# Patient Record
Sex: Male | Born: 1950 | Race: White | Hispanic: No | Smoking: Former smoker
Health system: Southern US, Community
[De-identification: ages and names within clinical notes are randomized; demographics above are authoritative.]

## PROBLEM LIST (undated history)

## (undated) DIAGNOSIS — D369 Benign neoplasm, unspecified site: Secondary | ICD-10-CM

## (undated) DIAGNOSIS — K219 Gastro-esophageal reflux disease without esophagitis: Secondary | ICD-10-CM

## (undated) DIAGNOSIS — I10 Essential (primary) hypertension: Secondary | ICD-10-CM

## (undated) DIAGNOSIS — G4733 Obstructive sleep apnea (adult) (pediatric): Secondary | ICD-10-CM

## (undated) DIAGNOSIS — E785 Hyperlipidemia, unspecified: Secondary | ICD-10-CM

## (undated) DIAGNOSIS — M109 Gout, unspecified: Secondary | ICD-10-CM

## (undated) DIAGNOSIS — C439 Malignant melanoma of skin, unspecified: Secondary | ICD-10-CM

## (undated) HISTORY — DX: Essential (primary) hypertension: I10

## (undated) HISTORY — DX: Benign neoplasm, unspecified site: D36.9

## (undated) HISTORY — PX: BASAL CELL CARCINOMA EXCISION: SHX1214

## (undated) HISTORY — PX: MELANOMA EXCISION: SHX5266

## (undated) HISTORY — DX: Hyperlipidemia, unspecified: E78.5

## (undated) HISTORY — DX: Malignant melanoma of skin, unspecified: C43.9

## (undated) HISTORY — DX: Obstructive sleep apnea (adult) (pediatric): G47.33

---

## 2013-06-24 ENCOUNTER — Ambulatory Visit: Payer: Self-pay | Admitting: Orthopedic Surgery

## 2014-06-16 LAB — HEPATIC FUNCTION PANEL
ALT: 24 U/L (ref 10–40)
AST: 19 U/L (ref 14–40)
Alkaline Phosphatase: 75 U/L (ref 25–125)
BILIRUBIN, TOTAL: 0.5 mg/dL

## 2014-06-16 LAB — LIPID PANEL
CHOLESTEROL: 224 mg/dL — AB (ref 0–200)
HDL: 57 mg/dL (ref 35–70)
LDL Cholesterol: 109 mg/dL
Triglycerides: 292 mg/dL — AB (ref 40–160)

## 2014-11-11 DIAGNOSIS — G4733 Obstructive sleep apnea (adult) (pediatric): Secondary | ICD-10-CM | POA: Insufficient documentation

## 2014-11-11 DIAGNOSIS — E785 Hyperlipidemia, unspecified: Secondary | ICD-10-CM | POA: Insufficient documentation

## 2014-11-11 DIAGNOSIS — C4491 Basal cell carcinoma of skin, unspecified: Secondary | ICD-10-CM | POA: Insufficient documentation

## 2014-11-11 DIAGNOSIS — I10 Essential (primary) hypertension: Secondary | ICD-10-CM | POA: Insufficient documentation

## 2014-11-11 DIAGNOSIS — C439 Malignant melanoma of skin, unspecified: Secondary | ICD-10-CM | POA: Insufficient documentation

## 2014-12-23 ENCOUNTER — Other Ambulatory Visit: Payer: Self-pay | Admitting: Family Medicine

## 2015-01-17 ENCOUNTER — Ambulatory Visit (INDEPENDENT_AMBULATORY_CARE_PROVIDER_SITE_OTHER): Payer: Commercial Managed Care - PPO | Admitting: Family Medicine

## 2015-01-17 ENCOUNTER — Encounter: Payer: Self-pay | Admitting: Family Medicine

## 2015-01-17 VITALS — BP 130/70 | HR 72 | Temp 98.2°F | Resp 16 | Ht 71.0 in | Wt 210.4 lb

## 2015-01-17 DIAGNOSIS — Z0000 Encounter for general adult medical examination without abnormal findings: Secondary | ICD-10-CM | POA: Diagnosis not present

## 2015-01-17 DIAGNOSIS — Z1211 Encounter for screening for malignant neoplasm of colon: Secondary | ICD-10-CM | POA: Diagnosis not present

## 2015-01-17 DIAGNOSIS — Z23 Encounter for immunization: Secondary | ICD-10-CM | POA: Diagnosis not present

## 2015-01-17 DIAGNOSIS — Z125 Encounter for screening for malignant neoplasm of prostate: Secondary | ICD-10-CM | POA: Diagnosis not present

## 2015-01-17 LAB — POCT URINALYSIS DIPSTICK
BILIRUBIN UA: NEGATIVE
GLUCOSE UA: NEGATIVE
KETONES UA: NEGATIVE
Leukocytes, UA: NEGATIVE
Nitrite, UA: NEGATIVE
Protein, UA: NEGATIVE
RBC UA: NEGATIVE
SPEC GRAV UA: 1.01
Urobilinogen, UA: 0.2
pH, UA: 7.5

## 2015-01-17 LAB — IFOBT (OCCULT BLOOD): IFOBT: NEGATIVE

## 2015-01-17 NOTE — Progress Notes (Signed)
Patient: Joe Blankenship, Male    DOB: May 26, 1950, 64 y.o.   MRN: [id number] Visit Date: 01/17/2015  Today's Provider: [name], MD   Chief Complaint  Patient presents with  . Annual Exam   Subjective:    Annual physical exam Joe Blankenship is a 64 y.o. male who presents today for health maintenance and complete physical. He feels well. He reports exercising, walks twice a week 2 miles. He reports he is sleeping fairly well.  -----------------------------------------------------------------   Review of Systems  Constitutional: Negative.   HENT: Negative.   Eyes: Negative.   Respiratory: Negative.   Cardiovascular: Negative.   Gastrointestinal: Negative.   Endocrine: Negative.   Genitourinary: Negative.   Musculoskeletal: Negative.   Skin: Negative.   Allergic/Immunologic: Negative.   Neurological: Negative.   Hematological: Negative.   Psychiatric/Behavioral: Negative.     Social History He  reports that he has never smoked. He has never used smokeless tobacco. He reports that he drinks about 2.4 oz of alcohol per week. He reports that he does not use illicit drugs. Social History   Social History  . Marital Status: Single    Spouse Name: N/A  . Number of Children: N/A  . Years of Education: N/A   Social History Main Topics  . Smoking status: Never Smoker   . Smokeless tobacco: Never Used  . Alcohol Use: 2.4 oz/week    4 Cans of beer per week  . Drug Use: No  . Sexual Activity: Not Asked   Other Topics Concern  . None   Social History Narrative    Patient Active Problem List   Diagnosis Date Noted  . Basal cell carcinoma 11/11/2014  . Essential (primary) hypertension 11/11/2014  . Malignant melanoma 11/11/2014  . Obstructive apnea 11/11/2014  . HLD (hyperlipidemia) 11/11/2014    History reviewed. No pertinent past surgical history.  Family History  Family Status  Relation Status Death Age  . Mother Alive   . Father Deceased     His family history includes Heart disease in his father; Pancreatitis in his mother.    No Known Allergies  Previous Medications   ASPIRIN 81 MG TABLET    Take by mouth daily.   HYDROCHLOROTHIAZIDE (HYDRODIURIL) 12.5 MG TABLET    TAKE 1 TABLET BY MOUTH EVERY DAY   NIACIN (NIASPAN) 500 MG CR TABLET    TAKE 1 TABLET BY MOUTH AT BEDTIME   OLMESARTAN-AMLODIPINE-HCTZ (TRIBENZOR) 40-10-25 MG TABS    Take by mouth daily.   PRAVASTATIN (PRAVACHOL) 40 MG TABLET    Take by mouth at bedtime.    Patient Care Team: Jerrol Banana., MD as PCP - General (Family Medicine)     Objective:   Vitals: BP 130/70 mmHg  Pulse 72  Temp(Src) 98.2 F (36.8 C) (Oral)  Resp 16  Ht 5\' 11"  (1.803 m)  Wt 210 lb 6.4 oz (95.437 kg)  BMI 29.36 kg/m2   Physical Exam  Constitutional: He is oriented to person, place, and time. He appears well-developed and well-nourished.  HENT:  Head: Normocephalic and atraumatic.  Right Ear: External ear normal.  Left Ear: External ear normal.  Nose: Nose normal.  Mouth/Throat: Oropharynx is clear and moist.  Eyes: Conjunctivae are normal. Pupils are equal, round, and reactive to light.  Neck: Neck supple.  Cardiovascular: Normal rate, regular rhythm, normal heart sounds and intact distal pulses.   Pulmonary/Chest: Effort normal and breath sounds normal.  Abdominal: Soft.  Musculoskeletal:  Normal range of motion.  Neurological: He is alert and oriented to person, place, and time.  Skin: Skin is warm and dry.  Psychiatric: He has a normal mood and affect. His behavior is normal. Thought content normal.     Depression Screen No flowsheet data found.    Assessment & Plan:     Routine Health Maintenance and Physical Exam  Exercise Activities and Dietary recommendations Goals    None      Immunization History  Administered Date(s) Administered  . Tdap 01/11/2013  . Zoster 01/11/2013    Health Maintenance  Topic Date Due  . Hepatitis C  Screening  March 11, 1951  . COLONOSCOPY  06/06/1998  . PNA vac Low Risk Adult (1 of 2 - PCV13) 06/06/2013  . INFLUENZA VACCINE  12/18/2014  . TETANUS/TDAP  01/12/2023  . ZOSTAVAX  Completed      Discussed health benefits of physical activity, and encouraged him to engage in regular exercise appropriate for his age and condition.   Update Prevnar. I have done the exam and reviewed the above chart and it is accurate to the best of my knowledge.  --------------------------------------------------------------------

## 2015-04-05 ENCOUNTER — Encounter: Payer: Self-pay | Admitting: Family Medicine

## 2015-04-05 ENCOUNTER — Ambulatory Visit (INDEPENDENT_AMBULATORY_CARE_PROVIDER_SITE_OTHER): Payer: Commercial Managed Care - PPO | Admitting: Family Medicine

## 2015-04-05 VITALS — BP 142/78 | HR 88 | Temp 97.7°F | Resp 16 | Wt 213.0 lb

## 2015-04-05 DIAGNOSIS — M109 Gout, unspecified: Secondary | ICD-10-CM

## 2015-04-05 DIAGNOSIS — M10071 Idiopathic gout, right ankle and foot: Secondary | ICD-10-CM

## 2015-04-05 MED ORDER — COLCHICINE 0.6 MG PO CAPS: 0.6000 mg | ORAL_CAPSULE | Freq: Two times a day (BID) | ORAL | Status: DC

## 2015-04-05 MED ORDER — PREDNISONE 10 MG (48) PO TBPK: ORAL_TABLET | Freq: Every day | ORAL | Status: DC

## 2015-04-05 MED ORDER — HYDROCODONE-ACETAMINOPHEN 5-325 MG PO TABS: 1.0000 | ORAL_TABLET | ORAL | Status: DC | PRN

## 2015-04-05 NOTE — Progress Notes (Signed)
Patient ID: Joe Blankenship, male   DOB: [birth year], 64 y.o.   MRN: [id number]    Subjective:  HPI Pt reports that about 5 days ago he started having right foot pain at his great toe. He has used epsom salt soak and ice and has not helped. He reports that it is very tender to the touch and it hurt to walk and bend his foot. He does not recall injuring his foot, however he was loading some bricks and squatted down and felt a "pull" in his foot but he was fine the rest of the day and the next day his foot hurt so bad he could not stand it. He reports that it even hurts when the bed sheets hits his foot sometimes. Pt thinks he may have gout, he has never had it before.  No fever, chills Prior to Admission medications   Medication Sig Start Date End Date Taking? Authorizing Provider  aspirin 81 MG tablet Take by mouth daily. 01/11/13  Yes Historical Provider, MD  hydrochlorothiazide (HYDRODIURIL) 12.5 MG tablet TAKE 1 TABLET BY MOUTH EVERY DAY 12/25/14  Yes Jerrol Banana., MD  niacin (NIASPAN) 500 MG CR tablet TAKE 1 TABLET BY MOUTH AT BEDTIME 12/25/14  Yes Jerrol Banana., MD  Olmesartan-Amlodipine-HCTZ [facility]) 40-10-25 MG TABS Take by mouth daily. 07/17/14  Yes Historical Provider, MD  pravastatin (PRAVACHOL) 40 MG tablet Take by mouth at bedtime. 10/06/14  Yes Historical Provider, MD    Patient Active Problem List   Diagnosis Date Noted  . Basal cell carcinoma 11/11/2014  . Essential (primary) hypertension 11/11/2014  . Malignant melanoma ([place]) 11/11/2014  . Obstructive apnea 11/11/2014  . HLD (hyperlipidemia) 11/11/2014    Past Medical History  Diagnosis Date  . Hypertension   . Hyperlipidemia   . Obstructive sleep apnea   . Melanoma ([place])   . Melanoma [facility])     Social History   Social History  . Marital Status: Single    Spouse Name: N/A  . Number of Children: N/A  . Years of Education: N/A   Occupational History  . Not on file.   Social History Main Topics  .  Smoking status: Never Smoker   . Smokeless tobacco: Never Used  . Alcohol Use: 2.4 oz/week    4 Cans of beer per week  . Drug Use: No  . Sexual Activity: Not on file   Other Topics Concern  . Not on file   Social History Narrative    No Known Allergies  Review of Systems  Constitutional: Negative.   HENT: Negative.   Eyes: Negative.   Respiratory: Negative.   Cardiovascular: Negative.   Gastrointestinal: Negative.   Genitourinary: Negative.   Musculoskeletal: Positive for joint pain.  Skin: Negative.   Neurological: Negative.   Endo/Heme/Allergies: Negative.   Psychiatric/Behavioral: Negative.     Immunization History  Administered Date(s) Administered  . Pneumococcal Conjugate-13 01/17/2015  . Tdap 01/11/2013  . Zoster 01/11/2013   Objective:  BP 142/78 mmHg  Pulse 88  Temp(Src) 97.7 F (36.5 C) (Oral)  Resp 16  Wt 213 lb (96.616 kg)  Physical Exam  Constitutional: He is oriented to person, place, and time and well-developed, well-nourished, and in no distress.  HENT:  Head: Normocephalic and atraumatic.  Cardiovascular: Normal rate and intact distal pulses.   Pulmonary/Chest: Effort normal.  Abdominal: Soft.  Musculoskeletal:  Diffuse warmth and erythema and swelling of the right foot, especially around the first. MTP joint  Neurological: He  is alert and oriented to person, place, and time.  Skin: Skin is warm and dry. No rash noted. There is erythema.  Psychiatric: Mood, memory, affect and judgment normal.    Lab Results  Component Value Date   CHOL 224* 06/16/2014   TRIG 292* 06/16/2014   HDL 57 06/16/2014   LDLCALC 109 06/16/2014    CMP     Component Value Date/Time   AST 19 06/16/2014   ALT 24 06/16/2014   ALKPHOS 75 06/16/2014    Assessment and Plan :  1. Acute gout of right foot, unspecified cause Cellulitis much less likely for this patient. We'll treat first with colchicine and in 2 days this is not starting to improve will switch  to/add prednisone. - Colchicine 0.6 MG CAPS; Take 0.6 mg by mouth 2 (two) times daily.  Dispense: 60 capsule; Refill: 2 - HYDROcodone-acetaminophen (NORCO/VICODIN) 5-325 MG tablet; Take 1 tablet by mouth every 4 (four) hours as needed for moderate pain.  Dispense: 45 tablet; Refill: 0 - predniSONE (STERAPRED UNI-PAK 48 TAB) 10 MG (48) TBPK tablet; Take by mouth daily.  Dispense: 48 tablet; Refill: 0 - CBC with Differential/Platelet - Uric acid Stop HCTZ while having gout flare.  Follow up 2-3 weeks.  2. Hypertension  Return to clinic in 2-3 weeks for recheck of blood pressure. Make adjustments if it  is up. 3. Hyperlipidemia [facility] 04/05/2015 8:39 AM

## 2015-04-06 LAB — CBC WITH DIFFERENTIAL/PLATELET
BASOS: 1 %
Basophils Absolute: 0.1 10*3/uL (ref 0.0–0.2)
EOS (ABSOLUTE): 0.2 10*3/uL (ref 0.0–0.4)
EOS: 2 %
HEMATOCRIT: 43.8 % (ref 37.5–51.0)
HEMOGLOBIN: 14.8 g/dL (ref 12.6–17.7)
IMMATURE GRANULOCYTES: 1 %
Immature Grans (Abs): 0.1 10*3/uL (ref 0.0–0.1)
LYMPHS ABS: 3.5 10*3/uL — AB (ref 0.7–3.1)
Lymphs: 34 %
MCH: 32 pg (ref 26.6–33.0)
MCHC: 33.8 g/dL (ref 31.5–35.7)
MCV: 95 fL (ref 79–97)
MONOCYTES: 12 %
Monocytes Absolute: 1.2 10*3/uL — ABNORMAL HIGH (ref 0.1–0.9)
NEUTROS PCT: 50 %
Neutrophils Absolute: 5.4 10*3/uL (ref 1.4–7.0)
Platelets: 290 10*3/uL (ref 150–379)
RBC: 4.63 x10E6/uL (ref 4.14–5.80)
RDW: 14.1 % (ref 12.3–15.4)
WBC: 10.3 10*3/uL (ref 3.4–10.8)

## 2015-04-06 LAB — URIC ACID: Uric Acid: 7.2 mg/dL (ref 3.7–8.6)

## 2015-04-18 ENCOUNTER — Encounter: Payer: Self-pay | Admitting: Family Medicine

## 2015-04-18 ENCOUNTER — Ambulatory Visit (INDEPENDENT_AMBULATORY_CARE_PROVIDER_SITE_OTHER): Payer: Commercial Managed Care - PPO | Admitting: Family Medicine

## 2015-04-18 VITALS — BP 162/84 | HR 80 | Temp 98.6°F | Resp 12 | Wt 214.0 lb

## 2015-04-18 DIAGNOSIS — I10 Essential (primary) hypertension: Secondary | ICD-10-CM | POA: Diagnosis not present

## 2015-04-18 DIAGNOSIS — M10071 Idiopathic gout, right ankle and foot: Secondary | ICD-10-CM | POA: Diagnosis not present

## 2015-04-18 DIAGNOSIS — M109 Gout, unspecified: Secondary | ICD-10-CM

## 2015-04-18 NOTE — Progress Notes (Signed)
Patient ID: Amy Usher, male   DOB: 08-May-1951, 64 y.o.   MRN: [id number]    Subjective:  HPI  Patient is here to follow up on gout in his right foot. This is about 90% better. He is taking Colchicine daily, he has not had to take Prednisone and took 2 days worth of Norco. Swelling is better and pain is better, he can walk on that foot with slight pressure present still. Last Uric acid was 7.2 on November 17th.     He is still not taking HCTZ as he was advised on his visit. He has not been checking his B/P. Prior to Admission medications   Medication Sig Start Date End Date Taking? Authorizing Provider  aspirin 81 MG tablet Take by mouth daily. 01/11/13  Yes Historical Provider, MD  Colchicine 0.6 MG CAPS Take 0.6 mg by mouth 2 (two) times daily. 04/05/15  Yes Richard Maceo Pro., MD  niacin (NIASPAN) 500 MG CR tablet TAKE 1 TABLET BY MOUTH AT BEDTIME 12/25/14  Yes Jerrol Banana., MD  Olmesartan-Amlodipine-HCTZ [facility]) 40-10-25 MG TABS Take by mouth daily. 07/17/14  Yes Historical Provider, MD  pravastatin (PRAVACHOL) 40 MG tablet Take by mouth at bedtime. 10/06/14  Yes Historical Provider, MD  hydrochlorothiazide (HYDRODIURIL) 12.5 MG tablet TAKE 1 TABLET BY MOUTH EVERY DAY Patient not taking: Reported on 04/18/2015 12/25/14   Jerrol Banana., MD  HYDROcodone-acetaminophen (NORCO/VICODIN) 5-325 MG tablet Take 1 tablet by mouth every 4 (four) hours as needed for moderate pain. Patient not taking: Reported on 04/18/2015 04/05/15   Jerrol Banana., MD  predniSONE (STERAPRED UNI-PAK 48 TAB) 10 MG (48) TBPK tablet Take by mouth daily. Patient not taking: Reported on 04/18/2015 04/05/15   Jerrol Banana., MD    Patient Active Problem List   Diagnosis Date Noted  . Basal cell carcinoma 11/11/2014  . Essential (primary) hypertension 11/11/2014  . Malignant melanoma ([place]) 11/11/2014  . Obstructive apnea 11/11/2014  . HLD (hyperlipidemia) 11/11/2014    Past  Medical History  Diagnosis Date  . Hypertension   . Hyperlipidemia   . Obstructive sleep apnea   . Melanoma ([place])   . Melanoma [facility])     Social History   Social History  . Marital Status: Single    Spouse Name: N/A  . Number of Children: N/A  . Years of Education: N/A   Occupational History  . Not on file.   Social History Main Topics  . Smoking status: Never Smoker   . Smokeless tobacco: Never Used  . Alcohol Use: 2.4 oz/week    4 Cans of beer per week  . Drug Use: No  . Sexual Activity: Not on file   Other Topics Concern  . Not on file   Social History Narrative    No Known Allergies  Review of Systems  Constitutional: Negative.   Eyes: Negative.   Respiratory: Negative.   Cardiovascular: Negative.   Gastrointestinal: Negative.   Musculoskeletal: Positive for joint pain.  Neurological: Negative.   Psychiatric/Behavioral: Negative.     Immunization History  Administered Date(s) Administered  . Pneumococcal Conjugate-13 01/17/2015  . Tdap 01/11/2013  . Zoster 01/11/2013   Objective:  BP 162/84 mmHg  Pulse 80  Temp(Src) 98.6 F (37 C)  Resp 12  Wt 214 lb (97.07 kg)  Physical Exam  Constitutional: He is oriented to person, place, and time and well-developed, well-nourished, and in no distress.  HENT:  Head: Normocephalic and atraumatic.  Right Ear: External ear normal.  Left Ear: External ear normal.  Nose: Nose normal.  Eyes: Conjunctivae are normal.  Neck: Neck supple.  Pulmonary/Chest: Effort normal.  Abdominal: Soft.  Musculoskeletal: He exhibits no edema or tenderness.   Foot exam back to normal.  Neurological: He is alert and oriented to person, place, and time. Gait normal.  Skin: Skin is warm and dry.  Psychiatric: Mood, memory, affect and judgment normal.    Lab Results  Component Value Date   WBC 10.3 04/05/2015   HCT 43.8 04/05/2015   CHOL 224* 06/16/2014   TRIG 292* 06/16/2014   HDL 57 06/16/2014   LDLCALC 109 06/16/2014      CMP     Component Value Date/Time   AST 19 06/16/2014   ALT 24 06/16/2014   ALKPHOS 75 06/16/2014    Assessment and Plan :  1. Acute gout of right foot, unspecified cause 90% better today. Discussed last uric acid level. Advised patient to take Colchicine 1 tablet daily for 2 more weeks and then can stop.Uric Acid was 7.2 .  2. Essential (primary) hypertension Elevated off medication. Discussed with patient that HCTZ in some people puts them more at risk for gout flare up. Patient feels comfortable with just re starting HCTZ. If he gets another flare up will need to switch to another medication for B/P.   Patient was seen and examined by Dr. Eulas Post and note was scribed by Theressa Millard, RMA.    Miguel Aschoff MD [facility] 04/18/2015 10:25 AM

## 2015-06-30 ENCOUNTER — Other Ambulatory Visit: Payer: Self-pay | Admitting: Family Medicine

## 2015-07-18 ENCOUNTER — Ambulatory Visit (INDEPENDENT_AMBULATORY_CARE_PROVIDER_SITE_OTHER): Payer: Commercial Managed Care - PPO | Admitting: Family Medicine

## 2015-07-18 ENCOUNTER — Encounter: Payer: Self-pay | Admitting: Family Medicine

## 2015-07-18 VITALS — BP 130/66 | HR 84 | Temp 97.6°F | Resp 16 | Wt 216.0 lb

## 2015-07-18 DIAGNOSIS — E785 Hyperlipidemia, unspecified: Secondary | ICD-10-CM

## 2015-07-18 DIAGNOSIS — M10071 Idiopathic gout, right ankle and foot: Secondary | ICD-10-CM | POA: Diagnosis not present

## 2015-07-18 DIAGNOSIS — M109 Gout, unspecified: Secondary | ICD-10-CM

## 2015-07-18 DIAGNOSIS — I10 Essential (primary) hypertension: Secondary | ICD-10-CM | POA: Diagnosis not present

## 2015-07-18 DIAGNOSIS — R739 Hyperglycemia, unspecified: Secondary | ICD-10-CM

## 2015-07-18 DIAGNOSIS — C4491 Basal cell carcinoma of skin, unspecified: Secondary | ICD-10-CM

## 2015-07-18 NOTE — Progress Notes (Signed)
Patient ID: Nubaid Fekete, male   DOB: 07/15/1950, 65 y.o.   MRN: [id number]    Subjective:  HPI  Patient is here for follow up:  Hypertension: Patient re started HCTZ in November 2016. B/P has been around 138-140/78-82 when he checks it. No cardiac symptoms. BP Readings from Last 3 Encounters:  07/18/15 130/66  04/18/15 162/84  04/05/15 142/78    Gout: Patient is not taking Colchicine anymore. No gout flare ups in a while so far.  He did not get labs done that were ordered in August so he will get those done today.  Prior to Admission medications   Medication Sig Start Date End Date Taking? Authorizing Provider  aspirin 81 MG tablet Take by mouth daily. 01/11/13  Yes Historical Provider, MD  Colchicine 0.6 MG CAPS Take 0.6 mg by mouth 2 (two) times daily. 04/05/15  Yes Richard Maceo Pro., MD  hydrochlorothiazide (HYDRODIURIL) 12.5 MG tablet TAKE 1 TABLET BY MOUTH EVERY DAY 12/25/14  Yes Jerrol Banana., MD  niacin (NIASPAN) 500 MG CR tablet TAKE 1 TABLET BY MOUTH AT BEDTIME 07/02/15  Yes Richard Maceo Pro., MD  Olmesartan-Amlodipine-HCTZ [facility]) 40-10-25 MG TABS Take by mouth daily. 07/17/14  Yes Historical Provider, MD  pravastatin (PRAVACHOL) 40 MG tablet Take by mouth at bedtime. 10/06/14  Yes Historical Provider, MD    Patient Active Problem List   Diagnosis Date Noted  . Basal cell carcinoma 11/11/2014  . Essential (primary) hypertension 11/11/2014  . Malignant melanoma ([place]) 11/11/2014  . Obstructive apnea 11/11/2014  . HLD (hyperlipidemia) 11/11/2014    Past Medical History  Diagnosis Date  . Hypertension   . Hyperlipidemia   . Obstructive sleep apnea   . Melanoma ([place])   . Melanoma [facility])     Social History   Social History  . Marital Status: Single    Spouse Name: N/A  . Number of Children: N/A  . Years of Education: N/A   Occupational History  . Not on file.   Social History Main Topics  . Smoking status: Never Smoker   . Smokeless tobacco:  Never Used  . Alcohol Use: 2.4 oz/week    4 Cans of beer per week  . Drug Use: No  . Sexual Activity: Not on file   Other Topics Concern  . Not on file   Social History Narrative    No Known Allergies  Review of Systems  Constitutional: Negative.   HENT: Negative.   Eyes: Negative.   Respiratory: Negative.   Cardiovascular: Negative.   Gastrointestinal: Negative.   Musculoskeletal: Negative.   Skin: Negative.   Endo/Heme/Allergies: Negative.   Psychiatric/Behavioral: Negative.     Immunization History  Administered Date(s) Administered  . Pneumococcal Conjugate-13 01/17/2015  . Tdap 01/11/2013  . Zoster 01/11/2013   Objective:  BP 130/66 mmHg  Pulse 84  Temp(Src) 97.6 F (36.4 C)  Resp 16  Wt 216 lb (97.977 kg)  Physical Exam  Constitutional: He is oriented to person, place, and time and well-developed, well-nourished, and in no distress.  HENT:  Head: Normocephalic and atraumatic.  Right Ear: External ear normal.  Left Ear: External ear normal.  Nose: Nose normal.  Eyes: Conjunctivae are normal. Pupils are equal, round, and reactive to light.  Neck: Neck supple. No thyromegaly present.  Cardiovascular: Normal rate, regular rhythm, normal heart sounds and intact distal pulses.   Pulmonary/Chest: Effort normal and breath sounds normal. No respiratory distress. He has no wheezes.  Abdominal: Soft.  Musculoskeletal: He exhibits no edema or tenderness.  Neurological: He is alert and oriented to person, place, and time. Gait normal.  Skin: Skin is warm and dry.  Psychiatric: Mood, memory, affect and judgment normal.    Lab Results  Component Value Date   WBC 10.3 04/05/2015   HCT 43.8 04/05/2015   PLT 290 04/05/2015   CHOL 224* 06/16/2014   TRIG 292* 06/16/2014   HDL 57 06/16/2014   LDLCALC 109 06/16/2014    CMP     Component Value Date/Time   AST 19 06/16/2014   ALT 24 06/16/2014   ALKPHOS 75 06/16/2014    Assessment and Plan :  1. Essential  (primary) hypertension Stable on HCTZ. Discussed that HCTZ  Can contribute to flare ups of gout but patient wants to continue current treatment with no change. - TSH  2. Acute gout of right foot, unspecified cause Resolved so far. He has not had to take colchicine in over a month. Will check uric acid today depending on results may need to stop hctz and re check levels in a few month to see if there is improvement. Pending results. - TSH  3. HLD (hyperlipidemia) Check levels, he did not get this done in august. - Lipid Panel With LDL/HDL Ratio - Comprehensive metabolic panel  4. Hyperglycemia Elevated in the past. Will check a1c. - HgB A1c  5. [place] Several. Following dermatologist. I have done the exam and reviewed the above chart and it is accurate to the best of my knowledge.  Patient was seen and examined by Dr. Eulas Post and note was scribed by Theressa Millard, RMA.   Miguel Aschoff MD [facility] 07/18/2015 8:09 AM

## 2015-07-19 LAB — COMPREHENSIVE METABOLIC PANEL
ALT: 24 IU/L (ref 0–44)
AST: 22 IU/L (ref 0–40)
Albumin/Globulin Ratio: 1.7 (ref 1.1–2.5)
Albumin: 4.8 g/dL (ref 3.6–4.8)
Alkaline Phosphatase: 68 IU/L (ref 39–117)
BUN/Creatinine Ratio: 23 — ABNORMAL HIGH (ref 10–22)
BUN: 20 mg/dL (ref 8–27)
Bilirubin Total: 0.4 mg/dL (ref 0.0–1.2)
CALCIUM: 9.9 mg/dL (ref 8.6–10.2)
CO2: 22 mmol/L (ref 18–29)
CREATININE: 0.88 mg/dL (ref 0.76–1.27)
Chloride: 99 mmol/L (ref 96–106)
GFR calc Af Amer: 103 mL/min/{1.73_m2} (ref >59)
GFR, EST NON AFRICAN AMERICAN: 89 mL/min/{1.73_m2} (ref >59)
GLUCOSE: 107 mg/dL — AB (ref 65–99)
Globulin, Total: 2.8 g/dL (ref 1.5–4.5)
POTASSIUM: 5.3 mmol/L — AB (ref 3.5–5.2)
Sodium: 141 mmol/L (ref 134–144)
TOTAL PROTEIN: 7.6 g/dL (ref 6.0–8.5)

## 2015-07-19 LAB — LIPID PANEL WITH LDL/HDL RATIO
Cholesterol, Total: 250 mg/dL — ABNORMAL HIGH (ref 100–199)
HDL: 54 mg/dL (ref >39)
Triglycerides: 573 mg/dL (ref 0–149)

## 2015-07-19 LAB — HEMOGLOBIN A1C
ESTIMATED AVERAGE GLUCOSE: 126 mg/dL
HEMOGLOBIN A1C: 6 % — AB (ref 4.8–5.6)

## 2015-07-19 LAB — URIC ACID: Uric Acid: 11.9 mg/dL — ABNORMAL HIGH (ref 3.7–8.6)

## 2015-07-19 LAB — TSH: TSH: 1.32 u[IU]/mL (ref 0.450–4.500)

## 2015-07-28 ENCOUNTER — Other Ambulatory Visit: Payer: Self-pay | Admitting: Family Medicine

## 2015-09-12 ENCOUNTER — Ambulatory Visit (INDEPENDENT_AMBULATORY_CARE_PROVIDER_SITE_OTHER): Payer: Commercial Managed Care - PPO | Admitting: Family Medicine

## 2015-09-12 ENCOUNTER — Encounter: Payer: Self-pay | Admitting: Family Medicine

## 2015-09-12 VITALS — BP 124/70 | Temp 98.2°F | Resp 16 | Ht 71.0 in | Wt 208.0 lb

## 2015-09-12 DIAGNOSIS — E781 Pure hyperglyceridemia: Secondary | ICD-10-CM | POA: Diagnosis not present

## 2015-09-12 DIAGNOSIS — I10 Essential (primary) hypertension: Secondary | ICD-10-CM

## 2015-09-12 DIAGNOSIS — M109 Gout, unspecified: Secondary | ICD-10-CM

## 2015-09-12 NOTE — Progress Notes (Signed)
Patient ID: Joe Blankenship, male   DOB: [birth year], 65 y.o.   MRN: [id number]       Patient: Joe Blankenship Male    DOB: Nov 09, 1950   65 y.o.   MRN: [id number] Visit Date: 09/12/2015  Today's Provider: [name], MD   Chief Complaint  Patient presents with  . Hypertension  . Gout    1 month FU.   Marland Kitchen Hyperlipidemia   Subjective:    HPI Patient is here today for a follow up.  Hypertension: Patient reports that he does check his BP occasionally. He reports that it runs 130/120s/60-70s. He reports that he is compliant with taking his BP medication.  BP Readings from Last 3 Encounters:  09/12/15 124/70  07/18/15 130/66  04/18/15 162/84   Gout: Patient's uric acid was elevated on 07/18/15 at 11.9. Patient currently does not take anything for gout. He reports that he has not had a flare up since LOV. We were considering starting Allopurinol in the future.  Elevated triglycerides: Patient's triglycerides were 573 on 07/18/2015. Patient reports that he has done more diet and exercise since LOV. He has lost 8lbs.     No Known Allergies Previous Medications   ASPIRIN 81 MG TABLET    Take by mouth daily.   COLCHICINE 0.6 MG CAPS    Take 0.6 mg by mouth 2 (two) times daily.   HYDROCHLOROTHIAZIDE (HYDRODIURIL) 12.5 MG TABLET    TAKE 1 TABLET BY MOUTH EVERY DAY   NIACIN (NIASPAN) 500 MG CR TABLET    TAKE 1 TABLET BY MOUTH AT BEDTIME   OLMESARTAN-AMLODIPINE-HCTZ 40-10-25 MG TABS    TAKE 1 TABLET BY MOUTH DAILY   PRAVASTATIN (PRAVACHOL) 40 MG TABLET    Take by mouth at bedtime.    Review of Systems  Constitutional: Negative.   Respiratory: Negative.   Cardiovascular: Negative.   Musculoskeletal: Negative.   Neurological: Negative.     Social History  Substance Use Topics  . Smoking status: Never Smoker   . Smokeless tobacco: Never Used  . Alcohol Use: 2.4 oz/week    4 Cans of beer per week   Objective:   BP 124/70 mmHg  Temp(Src) 98.2 F (36.8 C)  Resp 16  Ht 5\' 11"  (1.803  m)  Wt 208 lb (94.348 kg)  BMI 29.02 kg/m2  Physical Exam  Constitutional: He is oriented to person, place, and time. He appears well-developed and well-nourished.  HENT:  Head: Normocephalic and atraumatic.  Right Ear: External ear normal.  Left Ear: External ear normal.  Nose: Nose normal.  Cardiovascular: Normal rate, regular rhythm and normal heart sounds.   Pulmonary/Chest: Effort normal and breath sounds normal.  Musculoskeletal: He exhibits no edema.  Neurological: He is alert and oriented to person, place, and time.  Skin: Skin is warm.  Psychiatric: He has a normal mood and affect. His behavior is normal. Judgment and thought content normal.        Assessment & Plan:     1. Essential (primary) hypertension Stable. RTC 3-5 months. 2. Gout, unspecified cause, unspecified chronicity, unspecified site Stable. Patient reports that he has not had any flare ups since his last visit. Will continue to monitor. We'll repeat uric acid and lipids on next visit. 3. Hypertriglyceridemia Condition is stable with diet and exercise. Patient has lost 8lbs since last OV. Set weight loss goal to another 8lbs on F/U in 3-5 months.         Yuval Rubens Cranford Mon, MD  [facility]  

## 2015-10-29 ENCOUNTER — Other Ambulatory Visit: Payer: Self-pay | Admitting: Family Medicine

## 2015-12-14 ENCOUNTER — Ambulatory Visit (INDEPENDENT_AMBULATORY_CARE_PROVIDER_SITE_OTHER): Payer: Commercial Managed Care - PPO | Admitting: Family Medicine

## 2015-12-14 ENCOUNTER — Encounter: Payer: Self-pay | Admitting: Family Medicine

## 2015-12-14 VITALS — BP 136/74 | HR 72 | Temp 97.8°F | Resp 16 | Wt 211.0 lb

## 2015-12-14 DIAGNOSIS — M109 Gout, unspecified: Secondary | ICD-10-CM

## 2015-12-14 DIAGNOSIS — M10072 Idiopathic gout, left ankle and foot: Secondary | ICD-10-CM | POA: Diagnosis not present

## 2015-12-14 MED ORDER — COLCHICINE 0.6 MG PO CAPS: 0.6000 mg | ORAL_CAPSULE | Freq: Two times a day (BID) | ORAL | 5 refills | Status: DC

## 2015-12-14 NOTE — Progress Notes (Signed)
       Patient: Joe Blankenship Male    DOB: Oct 08, 1950   66 y.o.   MRN: [id number] Visit Date: 12/14/2015  Today's Provider: [name], MD   Chief Complaint  Patient presents with  . Foot Pain    X 2 weeks.    Subjective:    Foot Pain  This is a recurrent problem. The current episode started 1 to 4 weeks ago. The problem occurs constantly. The problem has been gradually worsening. Associated symptoms include arthralgias and joint swelling. The symptoms are aggravated by walking. He has tried heat, ice and relaxation for the symptoms. The treatment provided no relief.  Patient reports that the pain is in his left foot. The majority of his pain is located in his big toe. Patient has had history of gout and he believes he is having another flare.      No Known Allergies Current Meds  Medication Sig  . aspirin 81 MG tablet Take by mouth daily.  . hydrochlorothiazide (HYDRODIURIL) 12.5 MG tablet TAKE 1 TABLET BY MOUTH EVERY DAY  . niacin (NIASPAN) 500 MG CR tablet TAKE 1 TABLET BY MOUTH AT BEDTIME  . Olmesartan-Amlodipine-HCTZ 40-10-25 MG TABS TAKE 1 TABLET BY MOUTH DAILY  . pravastatin (PRAVACHOL) 40 MG tablet TAKE 1 TABLET BY MOUTH EVERY DAY    Review of Systems  Constitutional: Negative.   Musculoskeletal: Positive for arthralgias, gait problem and joint swelling.    Social History  Substance Use Topics  . Smoking status: Never Smoker  . Smokeless tobacco: Never Used  . Alcohol use 2.4 oz/week    4 Cans of beer per week   Objective:   BP 136/74 (BP Location: Right Arm, Patient Position: Sitting, Cuff Size: Normal)   Pulse 72   Temp 97.8 F (36.6 C)   Resp 16   Wt 211 lb (95.7 kg)   BMI 29.43 kg/m   Physical Exam  Musculoskeletal: He exhibits edema and tenderness.  Swelling and warmth with erythema with mild tenderness of the left great MTP joint  Skin: Skin is warm and dry. There is erythema.        Assessment & Plan:     1. Acute gout of left foot,  unspecified cause On next visit will consider treating with allopurinol with goal of uric acid less than 6. His last attack was a year ago so we discussed it and he wishes not to start allopurinol after just 2 attacks in a year. - Colchicine 0.6 MG CAPS; Take 0.6 mg by mouth 2 (two) times daily.  Dispense: 60 capsule; Refill: 5      I have done the exam and reviewed the above chart and it is accurate to the best of my knowledge.  Arianah Torgeson Cranford Mon, MD  [facility]

## 2015-12-25 ENCOUNTER — Other Ambulatory Visit: Payer: Self-pay | Admitting: Family Medicine

## 2016-01-03 ENCOUNTER — Ambulatory Visit (INDEPENDENT_AMBULATORY_CARE_PROVIDER_SITE_OTHER): Payer: Commercial Managed Care - PPO | Admitting: Family Medicine

## 2016-01-03 ENCOUNTER — Encounter: Payer: Self-pay | Admitting: Family Medicine

## 2016-01-03 VITALS — BP 122/68 | HR 68 | Temp 98.0°F | Resp 16 | Wt 208.0 lb

## 2016-01-03 DIAGNOSIS — M109 Gout, unspecified: Secondary | ICD-10-CM

## 2016-01-03 DIAGNOSIS — I10 Essential (primary) hypertension: Secondary | ICD-10-CM

## 2016-01-03 DIAGNOSIS — G4700 Insomnia, unspecified: Secondary | ICD-10-CM | POA: Diagnosis not present

## 2016-01-03 DIAGNOSIS — M10079 Idiopathic gout, unspecified ankle and foot: Secondary | ICD-10-CM

## 2016-01-03 DIAGNOSIS — E785 Hyperlipidemia, unspecified: Secondary | ICD-10-CM | POA: Diagnosis not present

## 2016-01-03 MED ORDER — ALLOPURINOL 100 MG PO TABS: 100.0000 mg | ORAL_TABLET | Freq: Every day | ORAL | 5 refills | Status: DC

## 2016-01-03 NOTE — Progress Notes (Signed)
       Patient: Joe Blankenship Male    DOB: [birth year]   65 y.o.   MRN: [id number] Visit Date: 01/03/2016  Today's Provider: [name], MD   Chief Complaint  Patient presents with  . Hypertension  . Hyperlipidemia  . Gout   Subjective:    HPI Patient comes in today for a follow up on hypertension. Patient reports that he still checks his BP at home and it has been stable. Patient denies any chest pain, shortness of breath, or headaches. He has been compliant with taking his medications.  Patient is also here to follow up on cholesterol. Patient's last lipid panel was on 07/18/2015 and triglycerides were 573. Patient is currently on Pravastatin 40mg  daily and has been tolerating medication well.   Patient also mentions that he continues to have gout flares. He reports that he feels like chochicne 0.6mg  helps a little but not much.      No Known Allergies Current Meds  Medication Sig  . aspirin 81 MG tablet Take by mouth daily.  . Colchicine 0.6 MG CAPS Take 0.6 mg by mouth 2 (two) times daily.  . hydrochlorothiazide (HYDRODIURIL) 12.5 MG tablet TAKE 1 TABLET BY MOUTH EVERY DAY  . niacin (NIASPAN) 500 MG CR tablet TAKE 1 TABLET BY MOUTH AT BEDTIME  . Olmesartan-Amlodipine-HCTZ 40-10-25 MG TABS TAKE 1 TABLET BY MOUTH DAILY  . pravastatin (PRAVACHOL) 40 MG tablet TAKE 1 TABLET BY MOUTH EVERY DAY    Review of Systems  Constitutional: Negative.   Eyes: Negative.   Respiratory: Negative.   Cardiovascular: Negative.   Endocrine: Negative.   Musculoskeletal: Negative.   Allergic/Immunologic: Negative.   Neurological: Negative.   Psychiatric/Behavioral: Negative.     Social History  Substance Use Topics  . Smoking status: Never Smoker  . Smokeless tobacco: Never Used  . Alcohol use 2.4 oz/week    4 Cans of beer per week   Objective:   BP 122/68 (BP Location: Right Arm, Patient Position: Sitting, Cuff Size: Normal)   Pulse 68   Temp 98 F (36.7 C)   Resp 16   Wt  208 lb (94.3 kg)   BMI 29.01 kg/m   Physical Exam  Constitutional: He is oriented to person, place, and time. He appears well-developed and well-nourished.  HENT:  Head: Normocephalic and atraumatic.  Eyes: Conjunctivae are normal.  Neck: Neck supple. No thyromegaly present.  Cardiovascular: Normal rate, regular rhythm and normal heart sounds.   Pulmonary/Chest: Effort normal and breath sounds normal.  Abdominal: Soft.  Lymphadenopathy:    He has no cervical adenopathy.  Neurological: He is alert and oriented to person, place, and time.  Skin: Skin is warm and dry.  Psychiatric: He has a normal mood and affect. His behavior is normal. Judgment and thought content normal.        Assessment & Plan:     1. Essential (primary) hypertension   2. HLD (hyperlipidemia)   3. Gout of foot, unspecified cause, unspecified chronicity, unspecified laterality Presently controlled. Try to get uric acid less than 6. - allopurinol (ZYLOPRIM) 100 MG tablet; Take 1 tablet (100 mg total) by mouth daily.  Dispense: 30 tablet; Refill: 5  4. Insomnia Advised patient to stop all alcohol use at night to see if insomnia improves.         Richard Cranford Mon, MD  [facility]

## 2016-01-22 ENCOUNTER — Encounter: Payer: Commercial Managed Care - PPO | Admitting: Family Medicine

## 2016-03-05 ENCOUNTER — Ambulatory Visit (INDEPENDENT_AMBULATORY_CARE_PROVIDER_SITE_OTHER): Payer: Commercial Managed Care - PPO | Admitting: Family Medicine

## 2016-03-05 ENCOUNTER — Encounter: Payer: Self-pay | Admitting: Family Medicine

## 2016-03-05 VITALS — BP 130/72 | HR 64 | Temp 97.9°F | Resp 16 | Ht 70.0 in | Wt 212.0 lb

## 2016-03-05 DIAGNOSIS — Z23 Encounter for immunization: Secondary | ICD-10-CM

## 2016-03-05 DIAGNOSIS — M109 Gout, unspecified: Secondary | ICD-10-CM | POA: Diagnosis not present

## 2016-03-05 DIAGNOSIS — Z0000 Encounter for general adult medical examination without abnormal findings: Secondary | ICD-10-CM

## 2016-03-05 DIAGNOSIS — E7800 Pure hypercholesterolemia, unspecified: Secondary | ICD-10-CM | POA: Diagnosis not present

## 2016-03-05 DIAGNOSIS — G4700 Insomnia, unspecified: Secondary | ICD-10-CM | POA: Insufficient documentation

## 2016-03-05 DIAGNOSIS — I10 Essential (primary) hypertension: Secondary | ICD-10-CM

## 2016-03-05 DIAGNOSIS — Z1211 Encounter for screening for malignant neoplasm of colon: Secondary | ICD-10-CM

## 2016-03-05 LAB — POCT URINALYSIS DIPSTICK
BILIRUBIN UA: NEGATIVE
GLUCOSE UA: NEGATIVE
Ketones, UA: NEGATIVE
Leukocytes, UA: NEGATIVE
NITRITE UA: NEGATIVE
Protein, UA: NEGATIVE
RBC UA: NEGATIVE
Spec Grav, UA: 1.01
Urobilinogen, UA: 0.2
pH, UA: 7.5

## 2016-03-05 LAB — IFOBT (OCCULT BLOOD): IMMUNOLOGICAL FECAL OCCULT BLOOD TEST: NEGATIVE

## 2016-03-05 NOTE — Progress Notes (Signed)
Patient: Joe Blankenship, Male    DOB: 1951-01-27, 65 y.o.   MRN: [id number] Visit Date: 03/05/2016  Today's Provider: [name], MD   Chief Complaint  Patient presents with  . Annual Exam  . Gout  . Insomnia   Subjective:    Annual physical exam Joe Blankenship is a 65 y.o. male who presents today for health maintenance and complete physical. He feels well. He reports exercising daily; walks for about a mile. He reports he is sleeping fairly well.  Never had colonoscopy. Refuses flu vaccine today.  -----------------------------------------------------------------  Follow up for Gout  The patient was last seen for this 2 months ago. Changes made at last visit include starting allopurinol.  He reports fair compliance with treatment. States he forgets to take medication about half of the time. He feels that condition is Improved. He is not having side effects.  ------------------------------------------------------------------------------------  Follow up for Insomnia  The patient was last seen for this 2 months ago. Changes made at last visit include stopping all alcohol.  He reports poor compliance with treatment. Still drinking about 3 beers per night. He feels that condition is Unchanged.   ------------------------------------------------------------------------------------       Review of Systems  Constitutional: Negative.   HENT: Negative.   Eyes: Negative.   Respiratory: Negative.   Cardiovascular: Negative.   Gastrointestinal: Negative.   Endocrine: Negative.   Genitourinary: Negative.   Musculoskeletal: Negative.   Skin: Negative.   Allergic/Immunologic: Negative.   Neurological: Negative.   Hematological: Negative.   Psychiatric/Behavioral: Negative.     Social History      He  reports that he quit smoking about 38 years ago. He quit after 14.00 years of use. He has never used smokeless tobacco. He reports that he drinks about 12.6  oz of alcohol per week . He reports that he does not use drugs.       Social History   Social History  . Marital status: Single    Spouse name: N/A  . Number of children: 0  . Years of education: N/A   Occupational History  .  [employer]   Social History Main Topics  . Smoking status: Former Smoker    Years: 14.00    Quit date: 05/18/1977  . Smokeless tobacco: Never Used  . Alcohol use 12.6 oz/week    21 Cans of beer per week  . Drug use: No  . Sexual activity: Not Currently   Other Topics Concern  . None   Social History Narrative  . None    Past Medical History:  Diagnosis Date  . Hyperlipidemia   . Hypertension   . Melanoma ([place])   . Melanoma ([place])   . Obstructive sleep apnea      Patient Active Problem List   Diagnosis Date Noted  . Basal cell carcinoma 11/11/2014  . Essential (primary) hypertension 11/11/2014  . Malignant melanoma ([place]) 11/11/2014  . Obstructive apnea 11/11/2014  . HLD (hyperlipidemia) 11/11/2014    Past Surgical History:  Procedure Laterality Date  . BASAL CELL CARCINOMA EXCISION    . MELANOMA EXCISION     x 2    Family History        Family Status  Relation Status  . Mother Alive  . Father Deceased at age 74        His family history includes Heart attack in his father; Heart disease in his father; Hyperlipidemia in his father; Hypertension in his  father; Pancreatitis in his mother.    No Known Allergies  Current Meds  Medication Sig  . allopurinol (ZYLOPRIM) 100 MG tablet Take 1 tablet (100 mg total) by mouth daily.  Marland Kitchen aspirin 81 MG tablet Take by mouth daily.  . hydrochlorothiazide (HYDRODIURIL) 12.5 MG tablet TAKE 1 TABLET BY MOUTH EVERY DAY  . niacin (NIASPAN) 500 MG CR tablet TAKE 1 TABLET BY MOUTH AT BEDTIME  . Olmesartan-Amlodipine-HCTZ 40-10-25 MG TABS TAKE 1 TABLET BY MOUTH DAILY  . pravastatin (PRAVACHOL) 40 MG tablet TAKE 1 TABLET BY MOUTH EVERY DAY    Patient Care Team: Jerrol Banana., MD as PCP  - General (Family Medicine)     Objective:   Vitals: BP 130/72 (BP Location: Right Arm, Patient Position: Sitting, Cuff Size: Large)   Pulse 64   Temp 97.9 F (36.6 C) (Oral)   Resp 16   Ht 5\' 10"  (1.778 m)   Wt 212 lb (96.2 kg)   BMI 30.42 kg/m    Physical Exam  Constitutional: He is oriented to person, place, and time. He appears well-developed and well-nourished.  HENT:  Head: Normocephalic and atraumatic.  Right Ear: External ear normal.  Left Ear: External ear normal.  Nose: Nose normal.  Mouth/Throat: Oropharynx is clear and moist. No oropharyngeal exudate.  Eyes: Conjunctivae and EOM are normal. Pupils are equal, round, and reactive to light. Right eye exhibits no discharge. Left eye exhibits no discharge.  Neck: Normal range of motion. Neck supple. No tracheal deviation present. No thyromegaly present.  Cardiovascular: Normal rate, regular rhythm and normal heart sounds.   Pulmonary/Chest: Effort normal and breath sounds normal. No respiratory distress.  Abdominal: Soft. He exhibits no distension. There is no tenderness.  Genitourinary: Rectum normal, prostate normal and penis normal. Rectal exam shows guaiac negative stool.  Musculoskeletal: Normal range of motion. He exhibits no edema.  Lymphadenopathy:    He has no cervical adenopathy.  Neurological: He is alert and oriented to person, place, and time. He has normal reflexes.  Skin: Skin is warm and dry.  Psychiatric: He has a normal mood and affect. His behavior is normal. Judgment and thought content normal.     Depression Screen PHQ 2/9 Scores 03/05/2016 04/05/2015  PHQ - 2 Score 0 0      Assessment & Plan:     Routine Health Maintenance and Physical Exam  Exercise Activities and Dietary recommendations Goals    None      Immunization History  Administered Date(s) Administered  . Pneumococcal Conjugate-13 01/17/2015  . Tdap 01/11/2013  . Zoster 01/11/2013    Health Maintenance  Topic Date  Due  . COLONOSCOPY  06/06/1998  . PNA vac Low Risk Adult (2 of 2 - PPSV23) 01/17/2016  . INFLUENZA VACCINE  04/17/2016 (Originally 12/18/2015)  . TETANUS/TDAP  01/12/2023  . ZOSTAVAX  Completed  . Hepatitis C Screening  Completed      Discussed health benefits of physical activity, and encouraged him to engage in regular exercise appropriate for his age and condition.    -------------------------------------------------------------------- 1. Annual physical exam Stable. - POCT urinalysis dipstick Results for orders placed or performed in visit on 03/05/16  IFOBT POC (occult bld, rslt in office)  Result Value Ref Range   IFOBT Negative   POCT urinalysis dipstick  Result Value Ref Range   Color, UA yellow    Clarity, UA clear    Glucose, UA Negative    Bilirubin, UA Negative    Ketones, UA  Negative    Spec Grav, UA 1.010    Blood, UA Negative    pH, UA 7.5    Protein, UA Negative    Urobilinogen, UA 0.2    Nitrite, UA Negative    Leukocytes, UA Negative Negative     2. Gouty arthritis Stable on Allopurinol, though pt does not take daily as prescribed. Recheck labs. FU pending results. - Uric acid  3. Need for pneumococcal vaccination Administered today. - Pneumococcal polysaccharide vaccine 23-valent greater than or equal to 2yo subcutaneous/IM  4. Insomnia, unspecified type   5. Essential (primary) hypertension FU pending results. - Comprehensive metabolic panel - CBC with Differential/Platelet  6. Pure hypercholesterolemia FU pending results. - TSH - Lipid panel  7. Colon cancer screening [place] negative. Refer to Dr. Melvin Norris for colonoscopy. - IFOBT POC (occult bld, rslt in office) - Ambulatory referral to Gastroenterology    Patient seen and examined by Miguel Aschoff, MD, and note scribed by Renaldo Fiddler, CMA.  I have done the exam and reviewed the above chart and it is accurate to the best of my knowledge.  Richard Cranford Mon, MD  [facility]

## 2016-03-06 ENCOUNTER — Telehealth: Payer: Self-pay

## 2016-03-06 DIAGNOSIS — M109 Gout, unspecified: Secondary | ICD-10-CM

## 2016-03-06 LAB — COMPREHENSIVE METABOLIC PANEL
A/G RATIO: 1.6 (ref 1.2–2.2)
ALBUMIN: 4.7 g/dL (ref 3.6–4.8)
ALK PHOS: 63 IU/L (ref 39–117)
ALT: 25 IU/L (ref 0–44)
AST: 23 IU/L (ref 0–40)
BILIRUBIN TOTAL: 0.4 mg/dL (ref 0.0–1.2)
BUN / CREAT RATIO: 14 (ref 10–24)
BUN: 12 mg/dL (ref 8–27)
CO2: 25 mmol/L (ref 18–29)
CREATININE: 0.86 mg/dL (ref 0.76–1.27)
Calcium: 9.6 mg/dL (ref 8.6–10.2)
Chloride: 99 mmol/L (ref 96–106)
GFR calc Af Amer: 104 mL/min/{1.73_m2} (ref >59)
GFR calc non Af Amer: 90 mL/min/{1.73_m2} (ref >59)
GLOBULIN, TOTAL: 2.9 g/dL (ref 1.5–4.5)
Glucose: 101 mg/dL — ABNORMAL HIGH (ref 65–99)
POTASSIUM: 4.5 mmol/L (ref 3.5–5.2)
SODIUM: 140 mmol/L (ref 134–144)
Total Protein: 7.6 g/dL (ref 6.0–8.5)

## 2016-03-06 LAB — LIPID PANEL
CHOL/HDL RATIO: 3.9 ratio (ref 0.0–5.0)
CHOLESTEROL TOTAL: 217 mg/dL — AB (ref 100–199)
HDL: 56 mg/dL (ref >39)
LDL CALC: 94 mg/dL (ref 0–99)
Triglycerides: 335 mg/dL — ABNORMAL HIGH (ref 0–149)
VLDL Cholesterol Cal: 67 mg/dL — ABNORMAL HIGH (ref 5–40)

## 2016-03-06 LAB — CBC WITH DIFFERENTIAL/PLATELET
Basophils Absolute: 0.1 10*3/uL (ref 0.0–0.2)
Basos: 1 %
EOS (ABSOLUTE): 0.2 10*3/uL (ref 0.0–0.4)
EOS: 2 %
HEMATOCRIT: 42.5 % (ref 37.5–51.0)
Hemoglobin: 14.2 g/dL (ref 12.6–17.7)
Immature Grans (Abs): 0.1 10*3/uL (ref 0.0–0.1)
Immature Granulocytes: 1 %
LYMPHS ABS: 3.1 10*3/uL (ref 0.7–3.1)
Lymphs: 34 %
MCH: 31.6 pg (ref 26.6–33.0)
MCHC: 33.4 g/dL (ref 31.5–35.7)
MCV: 95 fL (ref 79–97)
MONOS ABS: 0.9 10*3/uL (ref 0.1–0.9)
Monocytes: 10 %
NEUTROS PCT: 52 %
Neutrophils Absolute: 4.9 10*3/uL (ref 1.4–7.0)
PLATELETS: 258 10*3/uL (ref 150–379)
RBC: 4.49 x10E6/uL (ref 4.14–5.80)
RDW: 14.1 % (ref 12.3–15.4)
WBC: 9.3 10*3/uL (ref 3.4–10.8)

## 2016-03-06 LAB — TSH: TSH: 1.01 u[IU]/mL (ref 0.450–4.500)

## 2016-03-06 LAB — URIC ACID: Uric Acid: 9 mg/dL — ABNORMAL HIGH (ref 3.7–8.6)

## 2016-03-06 MED ORDER — ALLOPURINOL 300 MG PO TABS: 300.0000 mg | ORAL_TABLET | Freq: Every day | ORAL | 5 refills | Status: DC

## 2016-03-06 NOTE — Telephone Encounter (Signed)
Advised patient of results. Allopurinol was increased to 300mg  and sent into the patient's pharmacy.

## 2016-03-06 NOTE — Telephone Encounter (Signed)
-----   Message from Jerrol Banana., MD sent at 03/06/2016  1:42 PM EDT ----- Labs okay. Uric acid is better but not at goal. Recommend increasing from 100-300 mg daily of uric acid.

## 2016-03-13 ENCOUNTER — Encounter: Payer: Self-pay | Admitting: Gastroenterology

## 2016-03-17 NOTE — Telephone Encounter (Signed)
Error

## 2016-03-24 ENCOUNTER — Other Ambulatory Visit: Payer: Self-pay

## 2016-03-24 ENCOUNTER — Telehealth: Payer: Self-pay

## 2016-03-24 NOTE — Telephone Encounter (Signed)
Screening Colonoscopy Z12.11 04/17/2016 [facility] Dr. Rosana Fret Pre cert

## 2016-03-24 NOTE — Telephone Encounter (Signed)
Gastroenterology Pre-Procedure Review  Request Date: 04/17/2016 Requesting Physician: Dr. Rosanna Randy  PATIENT REVIEW QUESTIONS: The patient responded to the following health history questions as indicated:    1. Are you having any GI issues? no 2. Do you have a personal history of Polyps? no 3. Do you have a family history of Colon Cancer or Polyps? yes (paternal cousin colon cancer) 4. Diabetes Mellitus? no 5. Joint replacements in the past 12 months?no 6. Major health problems in the past 3 months?no 7. Any artificial heart valves, MVP, or defibrillator?no    MEDICATIONS & ALLERGIES:    Patient reports the following regarding taking any anticoagulation/antiplatelet therapy:   Plavix, Coumadin, Eliquis, Xarelto, Lovenox, Pradaxa, Brilinta, or Effient? no Aspirin? yes (Heart Health)  Patient confirms/reports the following medications:  Current Outpatient Prescriptions  Medication Sig Dispense Refill  . allopurinol (ZYLOPRIM) 300 MG tablet Take 1 tablet (300 mg total) by mouth daily. 30 tablet 5  . aspirin 81 MG tablet Take by mouth daily.    . Colchicine 0.6 MG CAPS Take 0.6 mg by mouth 2 (two) times daily. 60 capsule 5  . hydrochlorothiazide (HYDRODIURIL) 12.5 MG tablet TAKE 1 TABLET BY MOUTH EVERY DAY 30 tablet 2  . niacin (NIASPAN) 500 MG CR tablet TAKE 1 TABLET BY MOUTH AT BEDTIME 30 tablet 5  . Olmesartan-Amlodipine-HCTZ 40-10-25 MG TABS TAKE 1 TABLET BY MOUTH DAILY 90 tablet 3  . pravastatin (PRAVACHOL) 40 MG tablet TAKE 1 TABLET BY MOUTH EVERY DAY 30 tablet 11   No current facility-administered medications for this visit.     Patient confirms/reports the following allergies:  No Known Allergies  No orders of the defined types were placed in this encounter.   AUTHORIZATION INFORMATION Primary Insurance: 1D#: Group #:  Secondary Insurance: 1D#: Group #:  SCHEDULE INFORMATION: Date: 04/17/2016 Time: Location: [facility]

## 2016-03-25 NOTE — Telephone Encounter (Signed)
Prior Josem Kaufmann is not required per Achilles Dunk phone number .

## 2016-04-17 ENCOUNTER — Encounter
Admission: RE | Disposition: A | Discharge status: Home / Self Care | Payer: Self-pay | Source: Ambulatory Visit | Attending: Gastroenterology

## 2016-04-17 ENCOUNTER — Ambulatory Visit: Payer: Commercial Managed Care - PPO | Admitting: Anesthesiology

## 2016-04-17 ENCOUNTER — Ambulatory Visit
Admission: RE | Admit: 2016-04-17 | Discharge: 2016-04-17 | Disposition: A | Discharge status: Home / Self Care | Payer: Commercial Managed Care - PPO | Source: Ambulatory Visit | Attending: Gastroenterology | Admitting: Gastroenterology

## 2016-04-17 DIAGNOSIS — Z79899 Other long term (current) drug therapy: Secondary | ICD-10-CM | POA: Diagnosis not present

## 2016-04-17 DIAGNOSIS — Z1211 Encounter for screening for malignant neoplasm of colon: Secondary | ICD-10-CM | POA: Diagnosis not present

## 2016-04-17 DIAGNOSIS — G4733 Obstructive sleep apnea (adult) (pediatric): Secondary | ICD-10-CM | POA: Insufficient documentation

## 2016-04-17 DIAGNOSIS — Z85820 Personal history of malignant melanoma of skin: Secondary | ICD-10-CM | POA: Diagnosis not present

## 2016-04-17 DIAGNOSIS — D123 Benign neoplasm of transverse colon: Secondary | ICD-10-CM | POA: Insufficient documentation

## 2016-04-17 DIAGNOSIS — I10 Essential (primary) hypertension: Secondary | ICD-10-CM | POA: Insufficient documentation

## 2016-04-17 DIAGNOSIS — Z7982 Long term (current) use of aspirin: Secondary | ICD-10-CM | POA: Insufficient documentation

## 2016-04-17 DIAGNOSIS — D124 Benign neoplasm of descending colon: Secondary | ICD-10-CM | POA: Diagnosis not present

## 2016-04-17 DIAGNOSIS — E785 Hyperlipidemia, unspecified: Secondary | ICD-10-CM | POA: Insufficient documentation

## 2016-04-17 DIAGNOSIS — Z87891 Personal history of nicotine dependence: Secondary | ICD-10-CM | POA: Diagnosis not present

## 2016-04-17 DIAGNOSIS — K5730 Diverticulosis of large intestine without perforation or abscess without bleeding: Secondary | ICD-10-CM | POA: Diagnosis not present

## 2016-04-17 DIAGNOSIS — M109 Gout, unspecified: Secondary | ICD-10-CM | POA: Insufficient documentation

## 2016-04-17 DIAGNOSIS — K219 Gastro-esophageal reflux disease without esophagitis: Secondary | ICD-10-CM | POA: Insufficient documentation

## 2016-04-17 DIAGNOSIS — D122 Benign neoplasm of ascending colon: Secondary | ICD-10-CM | POA: Diagnosis not present

## 2016-04-17 HISTORY — DX: Gastro-esophageal reflux disease without esophagitis: K21.9

## 2016-04-17 HISTORY — DX: Gout, unspecified: M10.9

## 2016-04-17 HISTORY — PX: COLONOSCOPY WITH PROPOFOL: SHX5780

## 2016-04-17 SURGERY — COLONOSCOPY WITH PROPOFOL
Anesthesia: General

## 2016-04-17 MED ORDER — PROPOFOL 10 MG/ML IV BOLUS
INTRAVENOUS | Status: DC | PRN
  Administered 2016-04-17: 60 mg via INTRAVENOUS

## 2016-04-17 MED ORDER — MIDAZOLAM HCL 2 MG/2ML IJ SOLN
INTRAMUSCULAR | Status: DC | PRN
  Administered 2016-04-17: 1 mg via INTRAVENOUS

## 2016-04-17 MED ORDER — LIDOCAINE HCL (CARDIAC) 20 MG/ML IV SOLN
INTRAVENOUS | Status: DC | PRN
  Administered 2016-04-17: 60 mg via INTRAVENOUS

## 2016-04-17 MED ORDER — PROPOFOL 500 MG/50ML IV EMUL
INTRAVENOUS | Status: DC | PRN
  Administered 2016-04-17: 150 ug/kg/min via INTRAVENOUS

## 2016-04-17 MED ORDER — PHENYLEPHRINE HCL 10 MG/ML IJ SOLN
INTRAMUSCULAR | Status: DC | PRN
Start: 2016-04-17 — End: 2016-04-17
  Administered 2016-04-17 (×3): 100 ug via INTRAVENOUS

## 2016-04-17 MED ORDER — SODIUM CHLORIDE 0.9 % IV SOLN
INTRAVENOUS | Status: DC
  Administered 2016-04-17 (×3): via INTRAVENOUS

## 2016-04-17 NOTE — Transfer of Care (Signed)
Immediate Anesthesia Transfer of Care Note  Patient: Joe Blankenship  Procedure(s) Performed: Procedure(s): COLONOSCOPY WITH PROPOFOL (N/A)  Patient Location: Endoscopy Unit  Anesthesia Type:General  Level of Consciousness: awake, oriented and patient cooperative  Airway & Oxygen Therapy: Patient Spontanous Breathing and Patient connected to nasal cannula oxygen  Post-op Assessment: Report given to RN, Post -op Vital signs reviewed and stable and Patient moving all extremities X 4  Post vital signs: Reviewed and stable  Last Vitals:  Vitals:   04/17/16 0752  BP: (!) 143/68  Pulse: 82  Resp: 16  Temp: 36.8 C    Last Pain:  Vitals:   04/17/16 0752  TempSrc: Tympanic         Complications: No apparent anesthesia complications

## 2016-04-17 NOTE — Op Note (Signed)
[facility] Gastroenterology Patient Name: Tricia Guilbeau Procedure Date: 04/17/2016 8:32 AM MRN: [id number] Account #: [id number] Date of Birth: 06-28-1950 Admit Type: Outpatient Age: 65 Room: [facility] ENDO ROOM 3 Gender: Male Note Status: Finalized Procedure:            Colonoscopy Indications:          Screening for colorectal malignant neoplasm Providers:            Jonathon Bellows MD, MD Referring MD:         Janine Ores. Rosanna Randy, MD (Referring MD) Medicines:            Monitored Anesthesia Care Complications:        No immediate complications. Procedure:            Pre-Anesthesia Assessment:                       - Prior to the procedure, a History and Physical was                        performed, and patient medications, allergies and                        sensitivities were reviewed. The patient's tolerance of                        previous anesthesia was reviewed.                       - The risks and benefits of the procedure and the                        sedation options and risks were discussed with the                        patient. All questions were answered and informed                        consent was obtained.                       - The risks and benefits of the procedure and the                        sedation options and risks were discussed with the                        patient. All questions were answered and informed                        consent was obtained.                       - ASA Grade Assessment: III - A patient with severe                        systemic disease.                       After obtaining informed consent, the colonoscope was  passed under direct vision. Throughout the procedure,                        the patient's blood pressure, pulse, and oxygen                        saturations were monitored continuously. The                        Colonoscope was introduced through the anus and             advanced to the the cecum, identified by the                        appendiceal orifice, IC valve and transillumination.                        The colonoscopy was performed with ease. The patient                        tolerated the procedure well. The quality of the bowel                        preparation was excellent. Findings:      Multiple medium-mouthed diverticula were found in the entire colon.      A 6 mm, non-bleeding polyp was found in the descending colon. The polyp       was sessile. The polyp was removed with a cold biopsy forceps. Resection       and retrieval were complete.      A 6 mm polyp was found in the transverse colon. The polyp was sessile.       The polyp was removed with a cold biopsy forceps. Resection and       retrieval were complete.      A 8 mm polyp was found in the distal ascending colon. The polyp was       sessile. The polyp was removed with a cold snare. Resection and       retrieval were complete.      Three sessile polyps were found in the ascending colon. The polyps were       3 to 6 mm in size. These polyps were removed with a cold biopsy forceps.       Resection and retrieval were complete.      The perianal and digital rectal examinations were normal. Impression:           - Diverticulosis in the entire examined colon.                       - One 6 mm, non-bleeding polyp in the descending colon,                        removed with a cold biopsy forceps. Resected and                        retrieved.                       - One 6 mm polyp in the transverse colon, removed with  a cold biopsy forceps. Resected and retrieved.                       - One 8 mm polyp in the distal ascending colon, removed                        with a cold snare. Resected and retrieved.                       - Three 3 to 6 mm polyps in the ascending colon,                        removed with a cold biopsy forceps. Resected and                         retrieved. Recommendation:       - Discharge patient to home (with escort).                       - Resume previous diet.                       - Continue present medications.                       - Await pathology results.                       - Repeat colonoscopy in 3 years for surveillance. Procedure Code(s):    --- Professional ---                       [phone], Colonoscopy, flexible; with removal of tumor(s),                        polyp(s), or other lesion(s) by snare technique                       45380, 58, Colonoscopy, flexible; with biopsy, single                        or multiple Diagnosis Code(s):    --- Professional ---                       Z12.11, Encounter for screening for malignant neoplasm                        of colon                       D12.4, Benign neoplasm of descending colon                       D12.3, Benign neoplasm of transverse colon (hepatic                        flexure or splenic flexure)                       D12.2, Benign neoplasm of ascending colon                       K57.30, Diverticulosis  of large intestine without                        perforation or abscess without bleeding CPT copyright 2016 American Medical Association. All rights reserved. The codes documented in this report are preliminary and upon coder review may  be revised to meet current compliance requirements. Jonathon Bellows, MD Jonathon Bellows MD, MD 04/17/2016 9:05:37 AM This report has been signed electronically. Number of Addenda: 0 Note Initiated On: 04/17/2016 8:32 AM Scope Withdrawal Time: 0 hours 21 minutes 47 seconds  Total Procedure Duration: 0 hours 23 minutes 44 seconds       [facility]

## 2016-04-17 NOTE — H&P (Signed)
Jonathon Bellows MD [address] [place], [place] [address] Phone: [phone] Fax : [phone]  Primary Care Physician:  [name], MD Primary Gastroenterologist:  Dr. Jonathon Bellows   Pre-Procedure History & Physical: HPI:  Joe Blankenship is a 65 y.o. male is here for an colonoscopy.   Past Medical History:  Diagnosis Date  . GERD (gastroesophageal reflux disease)   . Gout   . Hyperlipidemia   . Hypertension   . Melanoma ([place])   . Melanoma ([place])   . Obstructive sleep apnea     Past Surgical History:  Procedure Laterality Date  . BASAL CELL CARCINOMA EXCISION    . MELANOMA EXCISION     x 2    Prior to Admission medications   Medication Sig Start Date End Date Taking? Authorizing Provider  allopurinol (ZYLOPRIM) 300 MG tablet Take 1 tablet (300 mg total) by mouth daily. 03/06/16  Yes Richard Maceo Pro., MD  aspirin 81 MG tablet Take by mouth daily. 01/11/13  Yes Historical Provider, MD  Colchicine 0.6 MG CAPS Take 0.6 mg by mouth 2 (two) times daily. 12/14/15  Yes Richard Maceo Pro., MD  hydrochlorothiazide (HYDRODIURIL) 12.5 MG tablet TAKE 1 TABLET BY MOUTH EVERY DAY 12/25/14  Yes Jerrol Banana., MD  niacin (NIASPAN) 500 MG CR tablet TAKE 1 TABLET BY MOUTH AT BEDTIME 12/25/15  Yes Jerrol Banana., MD  Olmesartan-Amlodipine-HCTZ 40-10-25 MG TABS TAKE 1 TABLET BY MOUTH DAILY 07/30/15  Yes Jerrol Banana., MD  pravastatin (PRAVACHOL) 40 MG tablet TAKE 1 TABLET BY MOUTH EVERY DAY 10/29/15  Yes Jerrol Banana., MD  ranitidine (ZANTAC) 150 MG tablet Take 150 mg by mouth at bedtime.   Yes Historical Provider, MD    Allergies as of 03/24/2016  . (No Known Allergies)    Family History  Problem Relation Age of Onset  . Pancreatitis Mother   . Heart disease Father   . Hyperlipidemia Father   . Hypertension Father   . Heart attack Father     Social History   Social History  . Marital status: Single    Spouse name: N/A  . Number of  children: 0  . Years of education: N/A   Occupational History  .  [employer]   Social History Main Topics  . Smoking status: Former Smoker    Years: 14.00    Quit date: 05/18/1977  . Smokeless tobacco: Never Used  . Alcohol use 1.8 oz/week    3 Cans of beer per week     Comment: tuesday  . Drug use: No  . Sexual activity: Not Currently   Other Topics Concern  . Not on file   Social History Narrative  . No narrative on file    Review of Systems: See HPI, otherwise negative ROS  Physical Exam: BP (!) 143/68   Pulse 82   Temp 98.2 F (36.8 C) (Tympanic)   Resp 16   Ht 5' 10.5" (1.791 m)   Wt 213 lb (96.6 kg)   SpO2 98%   BMI 30.13 kg/m  General:   Alert,  pleasant and cooperative in NAD Head:  Normocephalic and atraumatic. Neck:  Supple; no masses or thyromegaly. Lungs:  Clear throughout to auscultation.    Heart:  Regular rate and rhythm. Abdomen:  Soft, nontender and nondistended. Normal bowel sounds, without guarding, and without rebound.   Neurologic:  Alert and  oriented x4;  grossly normal neurologically.  Impression/Plan: Joe Blankenship  Byers is here for an colonoscopy to be performed for average risk colorectal cancer screening  Risks, benefits, limitations, and alternatives regarding  colonoscopy have been reviewed with the patient.  Questions have been answered.  All parties agreeable.   Jonathon Bellows, MD  04/17/2016, 8:09 AM

## 2016-04-17 NOTE — Anesthesia Preprocedure Evaluation (Signed)
Anesthesia Evaluation  Patient identified by MRN, date of birth, ID band Patient awake    Reviewed: Allergy & Precautions, H&P , NPO status , Patient's Chart, lab work & pertinent test results, reviewed documented beta blocker date and time   Airway Mallampati: II   Neck ROM: full    Dental  (+) Teeth Intact   Pulmonary neg pulmonary ROS, sleep apnea , former smoker,    Pulmonary exam normal        Cardiovascular hypertension, negative cardio ROS Normal cardiovascular exam Rhythm:regular Rate:Normal     Neuro/Psych negative neurological ROS  negative psych ROS   GI/Hepatic negative GI ROS, Neg liver ROS,   Endo/Other  negative endocrine ROS  Renal/GU negative Renal ROS  negative genitourinary   Musculoskeletal   Abdominal   Peds  Hematology negative hematology ROS (+)   Anesthesia Other Findings Past Medical History: No date: Hyperlipidemia No date: Hypertension No date: Melanoma ([place]) No date: Melanoma ([place]) No date: Obstructive sleep apnea Past Surgical History: No date: BASAL CELL CARCINOMA EXCISION No date: MELANOMA EXCISION     Comment: x 2   Reproductive/Obstetrics negative OB ROS                             Anesthesia Physical Anesthesia Plan  ASA: III  Anesthesia Plan: General   Post-op Pain Management:    Induction:   Airway Management Planned:   Additional Equipment:   Intra-op Plan:   Post-operative Plan:   Informed Consent: I have reviewed the patients History and Physical, chart, labs and discussed the procedure including the risks, benefits and alternatives for the proposed anesthesia with the patient or authorized representative who has indicated his/her understanding and acceptance.   Dental Advisory Given  Plan Discussed with: CRNA  Anesthesia Plan Comments:         Anesthesia Quick Evaluation

## 2016-04-18 ENCOUNTER — Encounter: Payer: Self-pay | Admitting: Gastroenterology

## 2016-04-18 LAB — SURGICAL PATHOLOGY

## 2016-04-18 NOTE — Anesthesia Postprocedure Evaluation (Signed)
Anesthesia Post Note  Patient: Hildred Priest  Procedure(s) Performed: Procedure(s) (LRB): COLONOSCOPY WITH PROPOFOL (N/A)  Patient location during evaluation: PACU Anesthesia Type: General Level of consciousness: awake and alert Pain management: pain level controlled Vital Signs Assessment: post-procedure vital signs reviewed and stable Respiratory status: spontaneous breathing, nonlabored ventilation, respiratory function stable and patient connected to nasal cannula oxygen Cardiovascular status: blood pressure returned to baseline and stable Postop Assessment: no signs of nausea or vomiting Anesthetic complications: no    Last Vitals:  Vitals:   04/17/16 0917 04/17/16 0927  BP: 99/64 116/78  Pulse: 64 67  Resp: 17 17  Temp:      Last Pain:  Vitals:   04/17/16 0927  TempSrc:   PainSc: 0-No pain                 Molli Barrows

## 2016-04-25 ENCOUNTER — Telehealth: Payer: Self-pay

## 2016-04-25 NOTE — Telephone Encounter (Signed)
-----   Message from Jonathon Bellows, MD sent at 04/23/2016 10:29 AM EST ----- Tubular adenomas x6-repeat colonoscopy in 3 years

## 2016-04-25 NOTE — Telephone Encounter (Signed)
Pt notified of colonoscopy results. Added to recall list.  

## 2016-05-27 ENCOUNTER — Other Ambulatory Visit: Payer: Self-pay | Admitting: Family Medicine

## 2016-06-26 ENCOUNTER — Other Ambulatory Visit: Payer: Self-pay | Admitting: Family Medicine

## 2016-08-24 ENCOUNTER — Other Ambulatory Visit: Payer: Self-pay | Admitting: Family Medicine

## 2016-08-24 DIAGNOSIS — M109 Gout, unspecified: Secondary | ICD-10-CM

## 2016-09-03 ENCOUNTER — Ambulatory Visit (INDEPENDENT_AMBULATORY_CARE_PROVIDER_SITE_OTHER): Payer: Commercial Managed Care - PPO | Admitting: Family Medicine

## 2016-09-03 VITALS — BP 132/68 | HR 68 | Temp 97.7°F | Resp 16 | Wt 217.0 lb

## 2016-09-03 DIAGNOSIS — R739 Hyperglycemia, unspecified: Secondary | ICD-10-CM

## 2016-09-03 DIAGNOSIS — I10 Essential (primary) hypertension: Secondary | ICD-10-CM | POA: Diagnosis not present

## 2016-09-03 DIAGNOSIS — M109 Gout, unspecified: Secondary | ICD-10-CM | POA: Diagnosis not present

## 2016-09-03 DIAGNOSIS — E7800 Pure hypercholesterolemia, unspecified: Secondary | ICD-10-CM

## 2016-09-03 DIAGNOSIS — K219 Gastro-esophageal reflux disease without esophagitis: Secondary | ICD-10-CM

## 2016-09-03 NOTE — Progress Notes (Signed)
Joe Blankenship  MRN: [id number] DOB: [birth year]  Subjective:  HPI  Patient is here for follow up. LOV was on 03/05/16 for CPE. B/P: patient checks b/p sometimes and readings have been around 134-140/78-80. No cardiac symptoms present. BP Readings from Last 3 Encounters:  09/03/16 132/68  04/17/16 116/78  03/05/16 130/72   Gout: last uric acid was done on  03/05/16 and level was better but not at goal-9.0. Allopurinol was increased to 300 mg daily. Patient has not had a flare up. He is also taking Colchicine daily.  GERD: Patient takes Zantac daily and has not had flare ups with this on this regimen. Last labs were done routine on 03/05/16 Patient Active Problem List   Diagnosis Date Noted  . Gouty arthritis 03/05/2016  . Insomnia 03/05/2016  . Basal cell carcinoma 11/11/2014  . Essential (primary) hypertension 11/11/2014  . Malignant melanoma ([place]) 11/11/2014  . Obstructive apnea 11/11/2014  . HLD (hyperlipidemia) 11/11/2014    Past Medical History:  Diagnosis Date  . GERD (gastroesophageal reflux disease)   . Gout   . Hyperlipidemia   . Hypertension   . Melanoma ([place])   . Melanoma ([place])   . Obstructive sleep apnea     Social History   Social History  . Marital status: Single    Spouse name: N/A  . Number of children: 0  . Years of education: N/A   Occupational History  .  [employer]   Social History Main Topics  . Smoking status: Former Smoker    Years: 14.00    Quit date: 05/18/1977  . Smokeless tobacco: Never Used  . Alcohol use 1.8 oz/week    3 Cans of beer per week     Comment: tuesday  . Drug use: No  . Sexual activity: Not Currently   Other Topics Concern  . Not on file   Social History Narrative  . No narrative on file    Outpatient Encounter Prescriptions as of 09/03/2016  Medication Sig Note  . allopurinol (ZYLOPRIM) 300 MG tablet TAKE 1 TABLET (300 MG TOTAL) BY MOUTH DAILY.   Marland Kitchen aspirin 81 MG tablet Take by mouth daily. 11/11/2014:  Received from: [place]: Take by mouth.  . Colchicine 0.6 MG CAPS Take 0.6 mg by mouth 2 (two) times daily.   . hydrochlorothiazide (HYDRODIURIL) 12.5 MG tablet TAKE 1 TABLET BY MOUTH EVERY DAY   . niacin (NIASPAN) 500 MG CR tablet TAKE 1 TABLET BY MOUTH AT BEDTIME   . Olmesartan-Amlodipine-HCTZ 40-10-25 MG TABS TAKE 1 TABLET BY MOUTH DAILY   . pravastatin (PRAVACHOL) 40 MG tablet TAKE 1 TABLET BY MOUTH EVERY DAY   . ranitidine (ZANTAC) 150 MG tablet Take 150 mg by mouth at bedtime.    No facility-administered encounter medications on file as of 09/03/2016.     No Known Allergies  Review of Systems  Constitutional: Negative.   Respiratory: Negative.   Cardiovascular: Negative.   Gastrointestinal: Negative.        Stable on medication  Musculoskeletal: Negative.   Skin: Negative.   Neurological: Negative.   Endo/Heme/Allergies: Negative.   Psychiatric/Behavioral: Negative.     Objective:  BP 132/68   Pulse 68   Temp 97.7 F (36.5 C)   Resp 16   Wt 217 lb (98.4 kg)   BMI 30.70 kg/m   Physical Exam  Constitutional: He is oriented to person, place, and time and well-developed, well-nourished, and in no distress.  HENT:  Head: Normocephalic  and atraumatic.  Eyes: Conjunctivae are normal. Pupils are equal, round, and reactive to light.  Neck: Normal range of motion. Neck supple.  Cardiovascular: Normal rate, regular rhythm, normal heart sounds and intact distal pulses.   No murmur heard. Pulmonary/Chest: Effort normal and breath sounds normal. No respiratory distress. He has no wheezes.  Musculoskeletal: He exhibits edema (trace). He exhibits no tenderness.  Neurological: He is alert and oriented to person, place, and time.  Psychiatric: Mood, memory, affect and judgment normal.    Assessment and Plan :  1. Gouty arthritis Re check levels today. If level on the re check is 6 or under will stop Colchicine and follow. Pending results. -  Uric acid  2. Essential (primary) hypertension Stable. Continue current medication. Patient is doing well. Patient is planning on retiring in 2 years. 3. Pure hypercholesterolemia 4. Hyperglycemia Check level today through labs. - HgB A1c  5. GERD without esophagitis Stable. Discussed with patient that he can try and go to Zantac every other day and see if symptoms are stable still if not can go to every day again. Follow.  HPI, Exam and A&P transcribed by Theressa Millard, RMA under direction and in the presence of Miguel Aschoff, MD. I have done the exam and reviewed the chart and it is accurate to the best of my knowledge. Development worker, community has been used and  any errors in dictation or transcription are unintentional. Miguel Aschoff M.D. [facility]

## 2016-09-04 LAB — HEMOGLOBIN A1C
ESTIMATED AVERAGE GLUCOSE: 120 mg/dL
Hgb A1c MFr Bld: 5.8 % — ABNORMAL HIGH (ref 4.8–5.6)

## 2016-09-04 LAB — URIC ACID: Uric Acid: 5.1 mg/dL (ref 3.7–8.6)

## 2016-09-09 NOTE — Progress Notes (Signed)
Advised  ED 

## 2016-10-22 ENCOUNTER — Other Ambulatory Visit: Payer: Self-pay | Admitting: Family Medicine

## 2017-01-18 ENCOUNTER — Other Ambulatory Visit: Payer: Self-pay | Admitting: Family Medicine

## 2017-01-18 DIAGNOSIS — M109 Gout, unspecified: Secondary | ICD-10-CM

## 2017-03-11 ENCOUNTER — Ambulatory Visit (INDEPENDENT_AMBULATORY_CARE_PROVIDER_SITE_OTHER): Payer: Commercial Managed Care - PPO | Admitting: Family Medicine

## 2017-03-11 VITALS — BP 130/60 | HR 80 | Temp 98.0°F | Resp 16 | Wt 217.0 lb

## 2017-03-11 DIAGNOSIS — I10 Essential (primary) hypertension: Secondary | ICD-10-CM

## 2017-03-11 DIAGNOSIS — R739 Hyperglycemia, unspecified: Secondary | ICD-10-CM

## 2017-03-11 DIAGNOSIS — E7800 Pure hypercholesterolemia, unspecified: Secondary | ICD-10-CM | POA: Diagnosis not present

## 2017-03-11 DIAGNOSIS — G4700 Insomnia, unspecified: Secondary | ICD-10-CM | POA: Diagnosis not present

## 2017-03-11 MED ORDER — MELATONIN 10 MG PO TABS: 10.0000 mg | ORAL_TABLET | Freq: Every evening | ORAL | 12 refills | Status: DC | PRN

## 2017-03-11 NOTE — Progress Notes (Signed)
Joe Blankenship  MRN: [id number] DOB: 1950/12/16  Subjective:  HPI   The patient is a 66 year old male who presents for 4 month follow up of chronic disease.  He was last seen on 09/03/16.  Hyperglycemia- The patient had his last A1C on the last visit and it was 5.8.  The patient does not check his glucose.  He has had no episodes or symptoms to suggest hypoglycemia.    Hypertension- The patient is currently on Olmesartan/Amlodipine/HCTZ 40/10/25 and has had controlled blood pressure readings.  The patient states that when he checks his blood pressure outside of the office his readings are good.  BP Readings from Last 3 Encounters:  03/11/17 130/60  09/03/16 132/68  04/17/16 116/78    Hyperlipidemia- It is time for his lipid check.  The patient is on Pravastatin 40 mg.  He denies any adverse effects of the medicine.  Lab Results  Component Value Date   CHOL 217 (H) 03/05/2016   HDL 56 03/05/2016   LDLCALC 94 03/05/2016   TRIG 335 (H) 03/05/2016   CHOLHDL 3.9 03/05/2016    GERD- Patient takes Ranitidine daily. This controls his symptoms and he does not have any breakthrough events.   Gout- Patient is on Allopurinol daily and has not had any flares since his last visit.   Insomnia- The patient complains that he has trouble getting enough sleep.  He falls asleep but usually wakes after just 4 hours and then can't go back to sleep.  This does not happen every day but most days and it does affect his energy level.  On the days he has had a good nights sleep he can notice  that he is able to do more.     Patient Active Problem List   Diagnosis Date Noted  . Gouty arthritis 03/05/2016  . Insomnia 03/05/2016  . Basal cell carcinoma 11/11/2014  . Essential (primary) hypertension 11/11/2014  . Malignant melanoma ([place]) 11/11/2014  . Obstructive apnea 11/11/2014  . HLD (hyperlipidemia) 11/11/2014    Past Medical History:  Diagnosis Date  . GERD (gastroesophageal reflux disease)     . Gout   . Hyperlipidemia   . Hypertension   . Melanoma ([place])   . Melanoma ([place])   . Obstructive sleep apnea     Social History   Social History  . Marital status: Single    Spouse name: N/A  . Number of children: 0  . Years of education: N/A   Occupational History  .  [employer]   Social History Main Topics  . Smoking status: Former Smoker    Years: 14.00    Quit date: 05/18/1977  . Smokeless tobacco: Never Used  . Alcohol use 1.8 oz/week    3 Cans of beer per week     Comment: tuesday  . Drug use: No  . Sexual activity: Not Currently   Other Topics Concern  . Not on file   Social History Narrative  . No narrative on file    Outpatient Encounter Prescriptions as of 03/11/2017  Medication Sig Note  . allopurinol (ZYLOPRIM) 300 MG tablet TAKE 1 TABLET (300 MG TOTAL) BY MOUTH DAILY.   Marland Kitchen aspirin 81 MG tablet Take by mouth daily. 11/11/2014: Received from: [place]: Take by mouth.  . niacin (NIASPAN) 500 MG CR tablet TAKE 1 TABLET BY MOUTH AT BEDTIME   . Olmesartan-Amlodipine-HCTZ 40-10-25 MG TABS TAKE 1 TABLET BY MOUTH DAILY   . pravastatin (PRAVACHOL)  40 MG tablet TAKE 1 TABLET BY MOUTH EVERY DAY   . ranitidine (ZANTAC) 150 MG tablet Take 150 mg by mouth at bedtime.   . Colchicine 0.6 MG CAPS Take 0.6 mg by mouth 2 (two) times daily. (Patient not taking: Reported on 03/11/2017)   . [DISCONTINUED] hydrochlorothiazide (HYDRODIURIL) 12.5 MG tablet TAKE 1 TABLET BY MOUTH EVERY DAY    No facility-administered encounter medications on file as of 03/11/2017.     No Known Allergies  Review of Systems  Constitutional: Negative for fever and malaise/fatigue.  HENT: Negative.   Respiratory: Negative for cough, shortness of breath and wheezing.   Cardiovascular: Negative for chest pain, palpitations, orthopnea, claudication and leg swelling.  Gastrointestinal: Negative.   Genitourinary: Negative for frequency.  Skin: Negative.    Neurological: Negative for dizziness, weakness and headaches.  Endo/Heme/Allergies: Negative for polydipsia.  Psychiatric/Behavioral: Negative.     Objective:  BP 130/60 (BP Location: Right Arm, Patient Position: Sitting, Cuff Size: Normal)   Pulse 80   Temp 98 F (36.7 C) (Oral)   Resp 16   Wt 217 lb (98.4 kg)   BMI 30.70 kg/m   Physical Exam  Constitutional: He is oriented to person, place, and time and well-developed, well-nourished, and in no distress.  HENT:  Head: Normocephalic and atraumatic.  Right Ear: External ear normal.  Left Ear: External ear normal.  Nose: Nose normal.  Eyes: Pupils are equal, round, and reactive to light. No scleral icterus.  Neck: Normal range of motion. No thyromegaly present.  Cardiovascular: Normal rate, regular rhythm and normal heart sounds.   Pulmonary/Chest: Effort normal and breath sounds normal.  Abdominal: Soft.  Musculoskeletal: He exhibits edema (trace).  Neurological: He is alert and oriented to person, place, and time. Gait normal. GCS score is 15.  Skin: Skin is warm and dry.  Psychiatric: Mood, memory, affect and judgment normal.    Assessment and Plan :   Gout Off of colchicine. HLD Terminal Insomnia Try Melatonin. HTN  I have done the exam and reviewed the chart and it is accurate to the best of my knowledge. Development worker, community has been used and  any errors in dictation or transcription are unintentional. Miguel Aschoff M.D. [facility]

## 2017-03-12 LAB — COMPLETE METABOLIC PANEL WITH GFR
AG RATIO: 1.6 (calc) (ref 1.0–2.5)
ALT: 27 U/L (ref 9–46)
AST: 27 U/L (ref 10–35)
Albumin: 4.5 g/dL (ref 3.6–5.1)
Alkaline phosphatase (APISO): 65 U/L (ref 40–115)
BILIRUBIN TOTAL: 0.3 mg/dL (ref 0.2–1.2)
BUN: 14 mg/dL (ref 7–25)
CHLORIDE: 104 mmol/L (ref 98–110)
CO2: 25 mmol/L (ref 20–32)
Calcium: 9.3 mg/dL (ref 8.6–10.3)
Creat: 0.8 mg/dL (ref 0.70–1.25)
GFR, EST AFRICAN AMERICAN: 106 mL/min/{1.73_m2} (ref >=60)
GFR, EST NON AFRICAN AMERICAN: 92 mL/min/{1.73_m2} (ref >=60)
GLOBULIN: 2.9 g/dL (ref 1.9–3.7)
Glucose, Bld: 117 mg/dL — ABNORMAL HIGH (ref 65–99)
POTASSIUM: 4.2 mmol/L (ref 3.5–5.3)
SODIUM: 138 mmol/L (ref 135–146)
TOTAL PROTEIN: 7.4 g/dL (ref 6.1–8.1)

## 2017-03-12 LAB — CBC WITH DIFFERENTIAL/PLATELET
BASOS ABS: 72 {cells}/uL (ref 0–200)
Basophils Relative: 1 %
EOS ABS: 187 {cells}/uL (ref 15–500)
Eosinophils Relative: 2.6 %
HCT: 42.3 % (ref 38.5–50.0)
Hemoglobin: 14.5 g/dL (ref 13.2–17.1)
Lymphs Abs: 1901 cells/uL (ref 850–3900)
MCH: 32.6 pg (ref 27.0–33.0)
MCHC: 34.3 g/dL (ref 32.0–36.0)
MCV: 95.1 fL (ref 80.0–100.0)
MPV: 11.3 fL (ref 7.5–12.5)
Monocytes Relative: 12.6 %
NEUTROS PCT: 57.4 %
Neutro Abs: 4133 cells/uL (ref 1500–7800)
PLATELETS: 255 10*3/uL (ref 140–400)
RBC: 4.45 10*6/uL (ref 4.20–5.80)
RDW: 13 % (ref 11.0–15.0)
TOTAL LYMPHOCYTE: 26.4 %
WBC: 7.2 10*3/uL (ref 3.8–10.8)
WBCMIX: 907 {cells}/uL (ref 200–950)

## 2017-03-12 LAB — HEMOGLOBIN A1C
EAG (MMOL/L): 6.5 (calc)
HEMOGLOBIN A1C: 5.7 %{Hb} — AB (ref <5.7)
MEAN PLASMA GLUCOSE: 117 (calc)

## 2017-03-12 LAB — LIPID PANEL
CHOLESTEROL: 208 mg/dL — AB (ref <200)
HDL: 47 mg/dL (ref >40)
NON-HDL CHOLESTEROL (CALC): 161 mg/dL — AB (ref <130)
TRIGLYCERIDES: 446 mg/dL — AB (ref <150)
Total CHOL/HDL Ratio: 4.4 (calc) (ref <5.0)

## 2017-03-12 LAB — TSH: TSH: 1.04 mIU/L (ref 0.40–4.50)

## 2017-03-16 ENCOUNTER — Telehealth: Payer: Self-pay

## 2017-03-16 NOTE — Telephone Encounter (Signed)
Left message advising pt.   Thanks,   -Naylea Wigington  

## 2017-03-16 NOTE — Telephone Encounter (Signed)
-----   Message from Jerrol Banana., MD sent at 03/13/2017 10:29 AM EDT ----- Labs stable. Triglycerides a little high. If patient has never  tried fish oil try 2 daily.

## 2017-05-18 ENCOUNTER — Other Ambulatory Visit: Payer: Self-pay | Admitting: Family Medicine

## 2017-06-17 ENCOUNTER — Other Ambulatory Visit: Payer: Self-pay | Admitting: Family Medicine

## 2017-09-09 ENCOUNTER — Ambulatory Visit (INDEPENDENT_AMBULATORY_CARE_PROVIDER_SITE_OTHER): Payer: Commercial Managed Care - PPO | Admitting: Family Medicine

## 2017-09-09 ENCOUNTER — Other Ambulatory Visit: Payer: Self-pay

## 2017-09-09 ENCOUNTER — Encounter: Payer: Self-pay | Admitting: Family Medicine

## 2017-09-09 VITALS — BP 138/70 | HR 80 | Temp 98.2°F | Resp 16 | Ht 70.0 in | Wt 213.0 lb

## 2017-09-09 DIAGNOSIS — I10 Essential (primary) hypertension: Secondary | ICD-10-CM

## 2017-09-09 DIAGNOSIS — Z1211 Encounter for screening for malignant neoplasm of colon: Secondary | ICD-10-CM | POA: Diagnosis not present

## 2017-09-09 DIAGNOSIS — E7800 Pure hypercholesterolemia, unspecified: Secondary | ICD-10-CM

## 2017-09-09 DIAGNOSIS — Z125 Encounter for screening for malignant neoplasm of prostate: Secondary | ICD-10-CM

## 2017-09-09 DIAGNOSIS — R739 Hyperglycemia, unspecified: Secondary | ICD-10-CM | POA: Diagnosis not present

## 2017-09-09 DIAGNOSIS — M109 Gout, unspecified: Secondary | ICD-10-CM | POA: Diagnosis not present

## 2017-09-09 DIAGNOSIS — Z0000 Encounter for general adult medical examination without abnormal findings: Secondary | ICD-10-CM | POA: Diagnosis not present

## 2017-09-09 LAB — IFOBT (OCCULT BLOOD): IFOBT: NEGATIVE

## 2017-09-09 NOTE — Progress Notes (Signed)
Patient: Joe Blankenship, Male    DOB: 24-May-1950, 67 y.o.   MRN: [id number] Visit Date: 09/09/2017  Today's Provider: [name], MD   Chief Complaint  Patient presents with  . Annual Exam   Subjective:  Joe Blankenship is a 67 y.o. male who presents today for health maintenance and complete physical. He feels well. He reports exercising none. He reports he is sleeping poorly.  Immunization History  Administered Date(s) Administered  . Pneumococcal Conjugate-13 01/17/2015  . Pneumococcal Polysaccharide-23 03/05/2016  . Tdap 01/11/2013  . Zoster 01/11/2013    04/17/16 Colon, Dr Erick Alley adenoma, hyperplastic polyp  Review of Systems  Constitutional: Negative.   HENT: Negative.   Eyes: Negative.   Respiratory: Negative.   Cardiovascular: Negative.   Gastrointestinal: Negative.   Endocrine: Negative.   Genitourinary: Negative.   Musculoskeletal: Negative.   Skin: Negative.   Allergic/Immunologic: Negative.   Neurological: Negative.   Hematological: Negative.   Psychiatric/Behavioral: Negative.     Social History   Socioeconomic History  . Marital status: Single    Spouse name: Not on file  . Number of children: 0  . Years of education: Not on file  . Highest education level: Not on file  Occupational History    Employer: [employer]  . Financial resource strain: Not on file  . Food insecurity:    Worry: Not on file    Inability: Not on file  . Transportation needs:    Medical: Not on file    Non-medical: Not on file  Tobacco Use  . Smoking status: Former Smoker    Years: 14.00    Last attempt to quit: 05/18/1977    Years since quitting: 40.3  . Smokeless tobacco: Never Used  Substance and Sexual Activity  . Alcohol use: Yes    Alcohol/week: 1.8 oz    Types: 3 Cans of beer per week    Comment: tuesday  . Drug use: No  . Sexual activity: Not Currently  Lifestyle  . Physical activity:    Days per week: Not on file    Minutes per  session: Not on file  . Stress: Not on file  Relationships  . Social connections:    Talks on phone: Not on file    Gets together: Not on file    Attends religious service: Not on file    Active member of club or organization: Not on file    Attends meetings of clubs or organizations: Not on file    Relationship status: Not on file  . Intimate partner violence:    Fear of current or ex partner: Not on file    Emotionally abused: Not on file    Physically abused: Not on file    Forced sexual activity: Not on file  Other Topics Concern  . Not on file  Social History Narrative  . Not on file    Patient Active Problem List   Diagnosis Date Noted  . Hyperglycemia 03/11/2017  . Gouty arthritis 03/05/2016  . Insomnia 03/05/2016  . Basal cell carcinoma 11/11/2014  . Essential (primary) hypertension 11/11/2014  . Malignant melanoma ([place]) 11/11/2014  . Obstructive apnea 11/11/2014  . HLD (hyperlipidemia) 11/11/2014    Past Surgical History:  Procedure Laterality Date  . BASAL CELL CARCINOMA EXCISION    . COLONOSCOPY WITH PROPOFOL N/A 04/17/2016   Procedure: COLONOSCOPY WITH PROPOFOL;  Surgeon: Jonathon Bellows, MD;  Location: [facility] ENDOSCOPY;  Service: Endoscopy;  Laterality: N/A;  . MELANOMA EXCISION  x 2    His family history includes Heart attack in his father; Heart disease in his father; Hyperlipidemia in his father; Hypertension in his father; Pancreatitis in his mother.     Outpatient Encounter Medications as of 09/09/2017  Medication Sig Note  . allopurinol (ZYLOPRIM) 300 MG tablet TAKE 1 TABLET (300 MG TOTAL) BY MOUTH DAILY.   Marland Kitchen aspirin 81 MG tablet Take by mouth daily. 11/11/2014: Received from: [place]: Take by mouth.  . Colchicine 0.6 MG CAPS Take 0.6 mg by mouth 2 (two) times daily.   . Melatonin 10 MG TABS Take 10 mg by mouth at bedtime as needed.   . niacin (NIASPAN) 500 MG CR tablet TAKE 1 TABLET BY MOUTH AT BEDTIME   .  Olmesartan-Amlodipine-HCTZ 40-10-25 MG TABS TAKE 1 TABLET BY MOUTH DAILY   . pravastatin (PRAVACHOL) 40 MG tablet TAKE 1 TABLET BY MOUTH EVERY DAY   . ranitidine (ZANTAC) 150 MG tablet Take 150 mg by mouth at bedtime.    No facility-administered encounter medications on file as of 09/09/2017.     Patient Care Team: Jerrol Banana., MD as PCP - General (Family Medicine)      Objective:   Vitals:  Vitals:   09/09/17 0921  BP: 138/70  Pulse: 80  Resp: 16  Temp: 98.2 F (36.8 C)  TempSrc: Oral  Weight: 213 lb (96.6 kg)  Height: 5\' 10"  (1.778 m)    Physical Exam  Constitutional: He is oriented to person, place, and time. He appears well-developed and well-nourished.  HENT:  Head: Normocephalic and atraumatic.  Right Ear: External ear normal.  Left Ear: External ear normal.  Nose: Nose normal.  Mouth/Throat: Oropharynx is clear and moist.  Eyes: Pupils are equal, round, and reactive to light. Conjunctivae and EOM are normal.  Neck: Normal range of motion. Neck supple.  Cardiovascular: Normal rate, regular rhythm, normal heart sounds and intact distal pulses.  Pulmonary/Chest: Effort normal and breath sounds normal.  Abdominal: Soft. Bowel sounds are normal.  Genitourinary: Rectum normal, prostate normal and penis normal.  Musculoskeletal: Normal range of motion. He exhibits edema.  Old Right AC separation.Trace edema.  Neurological: He is alert and oriented to person, place, and time.  Skin: Skin is warm and dry.  Psychiatric: He has a normal mood and affect. His behavior is normal. Judgment and thought content normal.     Depression Screen PHQ 2/9 Scores 09/09/2017 03/11/2017 03/05/2016 04/05/2015  PHQ - 2 Score 0 0 0 0  PHQ- 9 Score - 5 - -      Assessment & Plan:     Routine Health Maintenance and Physical Exam  Exercise Activities and Dietary recommendations Goals    None      Immunization History  Administered Date(s) Administered  .  Pneumococcal Conjugate-13 01/17/2015  . Pneumococcal Polysaccharide-23 03/05/2016  . Tdap 01/11/2013  . Zoster 01/11/2013    Health Maintenance  Topic Date Due  . INFLUENZA VACCINE  12/17/2017  . COLONOSCOPY  04/18/2019  . TETANUS/TDAP  01/12/2023  . Hepatitis C Screening  Completed  . PNA vac Low Risk Adult  Completed     Discussed health benefits of physical activity, and encouraged him to engage in regular exercise appropriate for his age and condition. HTN HLD Gout  I have done the exam and reviewed the chart and it is accurate to the best of my knowledge. Development worker, community has been used and  any errors in dictation  or transcription are unintentional. Miguel Aschoff M.D. [facility]

## 2017-09-10 LAB — HEMOGLOBIN A1C
ESTIMATED AVERAGE GLUCOSE: 114 mg/dL
Hgb A1c MFr Bld: 5.6 % (ref 4.8–5.6)

## 2017-09-10 LAB — COMPREHENSIVE METABOLIC PANEL
ALBUMIN: 4.7 g/dL (ref 3.6–4.8)
ALT: 30 IU/L (ref 0–44)
AST: 29 IU/L (ref 0–40)
Albumin/Globulin Ratio: 1.9 (ref 1.2–2.2)
Alkaline Phosphatase: 72 IU/L (ref 39–117)
BILIRUBIN TOTAL: 0.4 mg/dL (ref 0.0–1.2)
BUN / CREAT RATIO: 18 (ref 10–24)
BUN: 15 mg/dL (ref 8–27)
CHLORIDE: 101 mmol/L (ref 96–106)
CO2: 19 mmol/L — AB (ref 20–29)
CREATININE: 0.84 mg/dL (ref 0.76–1.27)
Calcium: 9.6 mg/dL (ref 8.6–10.2)
GFR calc Af Amer: 103 mL/min/{1.73_m2} (ref >59)
GFR calc non Af Amer: 89 mL/min/{1.73_m2} (ref >59)
GLUCOSE: 112 mg/dL — AB (ref 65–99)
Globulin, Total: 2.5 g/dL (ref 1.5–4.5)
Potassium: 4.2 mmol/L (ref 3.5–5.2)
Sodium: 137 mmol/L (ref 134–144)
Total Protein: 7.2 g/dL (ref 6.0–8.5)

## 2017-09-10 LAB — PSA: Prostate Specific Ag, Serum: 0.6 ng/mL (ref 0.0–4.0)

## 2017-09-10 LAB — LIPID PANEL
CHOLESTEROL TOTAL: 224 mg/dL — AB (ref 100–199)
Chol/HDL Ratio: 4.5 ratio (ref 0.0–5.0)
HDL: 50 mg/dL (ref >39)
TRIGLYCERIDES: 490 mg/dL — AB (ref 0–149)

## 2017-09-10 LAB — URIC ACID: Uric Acid: 6.3 mg/dL (ref 3.7–8.6)

## 2017-09-15 ENCOUNTER — Telehealth: Payer: Self-pay | Admitting: Family Medicine

## 2017-09-15 NOTE — Telephone Encounter (Signed)
Left patient a message with scheduled appt.

## 2017-10-18 ENCOUNTER — Other Ambulatory Visit: Payer: Self-pay | Admitting: Family Medicine

## 2017-10-19 ENCOUNTER — Ambulatory Visit: Payer: Commercial Managed Care - PPO | Admitting: Family Medicine

## 2017-10-19 ENCOUNTER — Encounter: Payer: Self-pay | Admitting: Family Medicine

## 2017-10-19 VITALS — BP 130/72 | HR 74 | Temp 97.7°F | Resp 16 | Wt 203.0 lb

## 2017-10-19 DIAGNOSIS — R42 Dizziness and giddiness: Secondary | ICD-10-CM | POA: Diagnosis not present

## 2017-10-19 DIAGNOSIS — R5383 Other fatigue: Secondary | ICD-10-CM

## 2017-10-19 DIAGNOSIS — F4321 Adjustment disorder with depressed mood: Secondary | ICD-10-CM

## 2017-10-19 DIAGNOSIS — R634 Abnormal weight loss: Secondary | ICD-10-CM | POA: Diagnosis not present

## 2017-10-19 DIAGNOSIS — Z114 Encounter for screening for human immunodeficiency virus [HIV]: Secondary | ICD-10-CM | POA: Diagnosis not present

## 2017-10-19 MED ORDER — ESCITALOPRAM OXALATE 5 MG PO TABS: 5.0000 mg | ORAL_TABLET | Freq: Every day | ORAL | 1 refills | Status: DC

## 2017-10-19 NOTE — Progress Notes (Signed)
Patient: Joe Blankenship Male    DOB: 1951-05-19   67 y.o.   MRN: [id number] Visit Date: 10/19/2017  Today's Provider: Lavon Paganini, MD   I, Martha Clan, CMA, am acting as scribe for Lavon Paganini, MD.  Chief Complaint  Patient presents with  . Dizziness   Subjective:    Dizziness  This is a new problem. Episode onset: x 3 weeks. The problem occurs intermittently. The problem has been unchanged. Associated symptoms include anorexia (has lost several pounds) and fatigue (constant). Pertinent negatives include no abdominal pain, arthralgias, change in bowel habit, chest pain, congestion, coughing, diaphoresis, fever, headaches, nausea, neck pain, numbness, swollen glands, urinary symptoms, visual change, vomiting or weakness. Nothing aggravates the symptoms.   Smaller portions due to lack of appetite.  Not intentional.  Has interest in doing things but doesn't feel like he has enough.  He then begins crying and states that he is stressed as his mother is in her 5s is nearing the end of her ife.  She is delirious and unwell.  He doesn't think he is depressed, but he knows that he is not coping well with this.    Wt Readings from Last 3 Encounters:  10/19/17 203 lb (92.1 kg)  09/09/17 213 lb (96.6 kg)  03/11/17 217 lb (98.4 kg)      Depression screen [facility] 2/9 10/19/2017 09/09/2017 03/11/2017 03/05/2016 04/05/2015  Decreased Interest 0 0 0 0 0  Down, Depressed, Hopeless 3 0 0 0 0  PHQ - 2 Score 3 0 0 0 0  Altered sleeping 3 - 3 - -  Tired, decreased energy 3 - 2 - -  Change in appetite 0 - 0 - -  Feeling bad or failure about yourself  0 - 0 - -  Trouble concentrating 0 - 0 - -  Moving slowly or fidgety/restless 0 - 0 - -  Suicidal thoughts 0 - 0 - -  PHQ-9 Score 9 - 5 - -  Difficult doing work/chores - - Not difficult at all - -     No Known Allergies   Current Outpatient Medications:  .  allopurinol (ZYLOPRIM) 300 MG tablet, TAKE 1 TABLET (300 MG TOTAL) BY MOUTH  DAILY., Disp: 30 tablet, Rfl: 11 .  aspirin 81 MG tablet, Take by mouth daily., Disp: , Rfl:  .  Colchicine 0.6 MG CAPS, Take 0.6 mg by mouth 2 (two) times daily., Disp: 60 capsule, Rfl: 5 .  Melatonin 10 MG TABS, Take 10 mg by mouth at bedtime as needed., Disp: 30 tablet, Rfl: 12 .  niacin (NIASPAN) 500 MG CR tablet, TAKE 1 TABLET BY MOUTH AT BEDTIME, Disp: 90 tablet, Rfl: 3 .  Olmesartan-Amlodipine-HCTZ 40-10-25 MG TABS, TAKE 1 TABLET BY MOUTH DAILY, Disp: 90 tablet, Rfl: 2 .  pravastatin (PRAVACHOL) 40 MG tablet, TAKE 1 TABLET BY MOUTH EVERY DAY, Disp: 30 tablet, Rfl: 11 .  ranitidine (ZANTAC) 150 MG tablet, Take 150 mg by mouth at bedtime., Disp: , Rfl:   Review of Systems  Constitutional: Positive for fatigue (constant). Negative for diaphoresis and fever.  HENT: Negative for congestion.   Respiratory: Negative for cough.   Cardiovascular: Negative for chest pain.  Gastrointestinal: Positive for anorexia (has lost several pounds). Negative for abdominal pain, change in bowel habit, nausea and vomiting.  Musculoskeletal: Negative for arthralgias and neck pain.  Neurological: Positive for dizziness. Negative for weakness, numbness and headaches.    Social History   Tobacco Use  .  Smoking status: Former Smoker    Years: 14.00    Last attempt to quit: 05/18/1977    Years since quitting: 40.4  . Smokeless tobacco: Never Used  Substance Use Topics  . Alcohol use: Yes    Alcohol/week: 1.8 oz    Types: 3 Cans of beer per week   Objective:   BP 130/72 (BP Location: Left Arm, Patient Position: Sitting, Cuff Size: Large)   Pulse 74   Temp 97.7 F (36.5 C) (Oral)   Resp 16   Wt 203 lb (92.1 kg)   SpO2 98%   BMI 29.13 kg/m  Vitals:   10/19/17 1029  BP: 130/72  Pulse: 74  Resp: 16  Temp: 97.7 F (36.5 C)  TempSrc: Oral  SpO2: 98%  Weight: 203 lb (92.1 kg)     Physical Exam  Constitutional: He is oriented to person, place, and time. He appears well-developed and  well-nourished. No distress.  HENT:  Head: Normocephalic and atraumatic.  Right Ear: External ear normal.  Left Ear: External ear normal.  Nose: Nose normal.  Mouth/Throat: Oropharynx is clear and moist. No oropharyngeal exudate.  Eyes: Pupils are equal, round, and reactive to light. Conjunctivae are normal. Right eye exhibits no discharge. Left eye exhibits no discharge. No scleral icterus.  Neck: Neck supple. No thyromegaly present.  Cardiovascular: Normal rate, regular rhythm, normal heart sounds and intact distal pulses.  No murmur heard. Pulmonary/Chest: Effort normal and breath sounds normal. No respiratory distress. He has no wheezes. He has no rales.  Abdominal: Soft. Bowel sounds are normal. He exhibits no distension. There is no tenderness.  Musculoskeletal: He exhibits no edema.  Lymphadenopathy:    He has no cervical adenopathy.  Neurological: He is alert and oriented to person, place, and time.  Skin: Skin is warm and dry. Capillary refill takes less than 2 seconds. No rash noted.  Psychiatric: His speech is normal and behavior is normal. Judgment and thought content normal. His mood appears not anxious. His affect is labile. His affect is not angry. Cognition and memory are normal. He expresses no homicidal and no suicidal ideation.  Intermittently tearful  Vitals reviewed.     Assessment & Plan:   1. Unintentional weight loss 2. Fatigue, unspecified type 3. Lightheadedness 4. Adjustment disorder with depressed mood - vague symptoms with broad differential - records show 10lb weight loss in last 6 weeks - Need to consider possible cancer that could be causing unintentional weight loss - UTD on colonoscopy and PSA recently wnl - Reviewed recent PSA, CMP, lipid panel, and FOBT - without any sign of cause for symptoms - check HIV, CBC, TSH as well - with patient tearful and going through a stressful time currently, is possible that adjustment disorder is contributing to  weight loss and fatigue/dizziness - discussed treatment options with patient and he agrees to try Lexapro - discussed possible side effects and that it can take 6-8 weeks to reach full efficacy - discussed potential for increased suicidality and how to seek help if this happens - follow-up with PCP in 2 months to see how medication is doing and if weight has stabilized - consider further imaging if patient continues to lose weight to look for any possible malignancy - CBC - TSH  5. Screening for HIV (human immunodeficiency virus) - HIV antibody (with reflex)    Meds ordered this encounter  Medications  . escitalopram (LEXAPRO) 5 MG tablet    Sig: Take 1 tablet (5 mg total) by  mouth daily.    Dispense:  30 tablet    Refill:  1     Return in about 2 months (around 12/19/2017) for weight loss and mood f/u.   The entirety of the information documented in the History of Present Illness, Review of Systems and Physical Exam were personally obtained by me. Portions of this information were initially documented by Raquel Sarna Ratchford, CMA and reviewed by me for thoroughness and accuracy.    Virginia Crews, MD, MPH [facility] 10/19/2017 11:34 AM

## 2017-10-19 NOTE — Patient Instructions (Signed)
Adjustment Disorder, Adult Adjustment disorder is a group of symptoms that can develop after a stressful life event, such as the loss of a job or serious physical illness. The symptoms can affect how you feel, think, and act. They may interfere with your relationships. Adjustment disorder increases your risk of suicide and substance abuse. If this disorder is not managed early, it can develop into a more serious condition, such as major depressive disorder or post-traumatic stress disorder. What are the causes? This condition happens when you have trouble recovering from or coping with a stressful life event. What increases the risk? You are more likely to develop this condition if:  You have had depression or anxiety.  You are being treated for a long-term (chronic) illness.  You are being treated for an illness that cannot be cured (terminal illness).  You have a family history of mental illness.  What are the signs or symptoms? Symptoms of this condition include:  Extreme trouble doing daily tasks, such as going to work.  Sadness, depression, or crying spells.  Worrying a lot.  Loss of enjoyment.  Change in appetite or weight.  Feelings of loss or hopelessness.  Thoughts of suicide.  Anxiety, worry, or nervousness.  Trouble sleeping.  Avoiding family and friends.  Fighting or vandalism.  Complaining of feeling sick without being ill.  Feeling dazed or disconnected.  Nightmares.  Trouble sleeping.  Irritability.  Reckless driving.  Poor work Systems analyst.  Ignoring bills.  Symptoms of this condition start within three months of the stressful event. They do not last more than six months, unless the stressful circumstances last longer. Normal grieving after the death of a loved one is not a symptom of this condition. How is this diagnosed? To diagnose this condition, your health care provider will ask about what has happened in your life and how it has  affected you. He or she may also ask about your medical history and your use of medicines, alcohol, and other substances. Your health care provider may do a physical exam and order lab tests or other studies. You may be referred to a mental health specialist. How is this treated? Treatment options for this condition include:  Counseling or talk therapy. Talk therapy is usually provided by mental health specialists.  Medicines. Certain medicines may help with depression, anxiety, and sleep.  Support groups. These offer emotional support, advice, and guidance. They are made up of people who have had similar experiences.  Observation and time. This is sometimes called "watchful waiting." In this treatment, health care providers monitor your health and behavior without other treatment. Adjustment disorder sometimes gets better on its own with time.  Follow these instructions at home:  Take over-the-counter and prescription medicines only as told by your health care provider.  Keep all follow-up visits as told by your health care provider. This is important. Contact a health care provider if:  Your symptoms do not improve in six months.  Your symptoms get worse. Get help right away if:  You have serious thoughts about hurting yourself or someone else. If you ever feel like you may hurt yourself or others, or have thoughts about taking your own life, get help right away. You can go to your nearest emergency department or call:  Your local emergency services (911 in the [place]  A suicide crisis helpline, such as the [facility] at (762) 705-9243. This is open 24 hours a day.  Summary  Adjustment disorder is a group of  symptoms that can develop after a stressful life event, such as the loss of a job or serious physical illness. The symptoms can affect how you feel, think, and act. They may interfere with your relationships.  Symptoms of this condition start within  three months of the stressful event. They do not last more than six months, unless the stressful circumstances last longer.  Treatment may include talk therapy, medicines, participation in a support group, or observation to see if symptoms improve.  Contact your health care provider if your symptoms get worse or do not improve in six months.  If you ever feel like you may hurt yourself or others, or have thoughts about taking your own life, get help right away. This information is not intended to replace advice given to you by your health care provider. Make sure you discuss any questions you have with your health care provider. Document Released: 01/07/2006 Document Revised: 07/04/2016 Document Reviewed: 07/04/2016 Elsevier Interactive Patient Education  [employer].

## 2017-10-20 ENCOUNTER — Telehealth: Payer: Self-pay

## 2017-10-20 LAB — CBC
HEMATOCRIT: 46.2 % (ref 37.5–51.0)
Hemoglobin: 15.7 g/dL (ref 13.0–17.7)
MCH: 33.2 pg — ABNORMAL HIGH (ref 26.6–33.0)
MCHC: 34 g/dL (ref 31.5–35.7)
MCV: 98 fL — AB (ref 79–97)
Platelets: 296 10*3/uL (ref 150–450)
RBC: 4.73 x10E6/uL (ref 4.14–5.80)
RDW: 13.4 % (ref 12.3–15.4)
WBC: 10.3 10*3/uL (ref 3.4–10.8)

## 2017-10-20 LAB — HIV ANTIBODY (ROUTINE TESTING W REFLEX): HIV SCREEN 4TH GENERATION: NONREACTIVE

## 2017-10-20 LAB — TSH: TSH: 2.4 u[IU]/mL (ref 0.450–4.500)

## 2017-10-20 NOTE — Telephone Encounter (Signed)
Left message advising pt. OK per DPR. 

## 2017-10-20 NOTE — Telephone Encounter (Signed)
-----   Message from Virginia Crews, MD sent at 10/20/2017  8:30 AM EDT ----- Normal Blood counts, Thyroid function.  Negative HIV.  No reason for weight loss.  If this continues, may need imaging at follow-up with Dr. Rosanna Randy.  Virginia Crews, MD, MPH [facility] 10/20/2017 8:30 AM

## 2017-12-14 ENCOUNTER — Other Ambulatory Visit: Payer: Self-pay | Admitting: Family Medicine

## 2017-12-31 ENCOUNTER — Ambulatory Visit: Payer: Commercial Managed Care - PPO | Admitting: Family Medicine

## 2017-12-31 VITALS — BP 130/62 | HR 68 | Temp 97.8°F | Resp 16 | Wt 216.0 lb

## 2017-12-31 DIAGNOSIS — M25511 Pain in right shoulder: Secondary | ICD-10-CM

## 2017-12-31 MED ORDER — NAPROXEN 500 MG PO TABS: 500.0000 mg | ORAL_TABLET | Freq: Two times a day (BID) | ORAL | 1 refills | Status: DC

## 2017-12-31 NOTE — Progress Notes (Signed)
Joe Blankenship  MRN: [id number] DOB: 05/06/1951  Subjective:  HPI   The patient is a 67 year old male who presents for follow up of chronic health.  He was last seen for this on 09/09/17.  He has had an acute visit since that time but states those issues have been resolved.   Hypertension BP Readings from Last 3 Encounters:  12/31/17 130/62  10/19/17 130/72  09/09/17 138/70   Right shoulder-patient has history of injury to his right should about 40 years ago.  He states that at the time he did not require any treatment.  He does say that it has been bothering him for about a month now and he has tingling in that right arm at time.  He has full range of motion and does not have much pain.   Patient Active Problem List   Diagnosis Date Noted  . Hyperglycemia 03/11/2017  . Gouty arthritis 03/05/2016  . Insomnia 03/05/2016  . Basal cell carcinoma 11/11/2014  . Essential (primary) hypertension 11/11/2014  . Malignant melanoma ([place]) 11/11/2014  . Obstructive apnea 11/11/2014  . HLD (hyperlipidemia) 11/11/2014    Past Medical History:  Diagnosis Date  . GERD (gastroesophageal reflux disease)   . Gout   . Hyperlipidemia   . Hypertension   . Melanoma ([place])   . Melanoma ([place])   . Obstructive sleep apnea   . Tubular adenoma     Social History   Socioeconomic History  . Marital status: Single    Spouse name: Not on file  . Number of children: 0  . Years of education: Not on file  . Highest education level: Not on file  Occupational History    Employer: [employer]  . Financial resource strain: Not on file  . Food insecurity:    Worry: Not on file    Inability: Not on file  . Transportation needs:    Medical: Not on file    Non-medical: Not on file  Tobacco Use  . Smoking status: Former Smoker    Years: 14.00    Last attempt to quit: 05/18/1977    Years since quitting: 40.6  . Smokeless tobacco: Never Used  Substance and Sexual Activity  . Alcohol use:  Yes    Alcohol/week: 3.0 standard drinks    Types: 3 Cans of beer per week  . Drug use: No  . Sexual activity: Not Currently  Lifestyle  . Physical activity:    Days per week: Not on file    Minutes per session: Not on file  . Stress: Not on file  Relationships  . Social connections:    Talks on phone: Not on file    Gets together: Not on file    Attends religious service: Not on file    Active member of club or organization: Not on file    Attends meetings of clubs or organizations: Not on file    Relationship status: Not on file  . Intimate partner violence:    Fear of current or ex partner: Not on file    Emotionally abused: Not on file    Physically abused: Not on file    Forced sexual activity: Not on file  Other Topics Concern  . Not on file  Social History Narrative  . Not on file    Outpatient Encounter Medications as of 12/31/2017  Medication Sig Note  . allopurinol (ZYLOPRIM) 300 MG tablet TAKE 1 TABLET (300 MG TOTAL) BY MOUTH DAILY.   Marland Kitchen  aspirin 81 MG tablet Take by mouth daily. 11/11/2014: Received from: [facility]: Take by mouth.  . Colchicine 0.6 MG CAPS Take 0.6 mg by mouth 2 (two) times daily.   Marland Kitchen escitalopram (LEXAPRO) 5 MG tablet TAKE 1 TABLET BY MOUTH EVERY DAY   . Melatonin 10 MG TABS Take 10 mg by mouth at bedtime as needed.   . niacin (NIASPAN) 500 MG CR tablet TAKE 1 TABLET BY MOUTH AT BEDTIME   . Olmesartan-Amlodipine-HCTZ 40-10-25 MG TABS TAKE 1 TABLET BY MOUTH DAILY   . pravastatin (PRAVACHOL) 40 MG tablet TAKE 1 TABLET BY MOUTH EVERY DAY   . ranitidine (ZANTAC) 150 MG tablet Take 150 mg by mouth at bedtime.    No facility-administered encounter medications on file as of 12/31/2017.     No Known Allergies  Review of Systems  Constitutional: Negative for fever and malaise/fatigue.  Eyes: Negative.   Respiratory: Negative for cough, shortness of breath and wheezing.   Cardiovascular: Negative for chest pain,  palpitations, orthopnea, claudication and leg swelling.  Gastrointestinal: Negative.   Musculoskeletal: Positive for joint pain.  Endo/Heme/Allergies: Negative.   Psychiatric/Behavioral: Negative.     Objective:  BP 130/62 (BP Location: Right Arm, Patient Position: Sitting, Cuff Size: Normal)   Pulse 68   Temp 97.8 F (36.6 C) (Oral)   Resp 16   Wt 216 lb (98 kg)   BMI 30.99 kg/m   Physical Exam  Constitutional: He is oriented to person, place, and time and well-developed, well-nourished, and in no distress.  HENT:  Head: Normocephalic and atraumatic.  Eyes: Conjunctivae are normal. No scleral icterus.  Neck: No thyromegaly present.  Cardiovascular: Normal rate, regular rhythm and normal heart sounds.  Pulmonary/Chest: Effort normal and breath sounds normal.  Abdominal: Soft.  Musculoskeletal: He exhibits no edema.  Pain with movement of right shoulder.  Neurological: He is alert and oriented to person, place, and time. Gait normal. GCS score is 15.  Skin: Skin is warm and dry.  Psychiatric: Mood, memory, affect and judgment normal.    Assessment and Plan :  1. Right shoulder pain, unspecified chronicity Will probably need ortho referral. - naproxen (NAPROSYN) 500 MG tablet; Take 1 tablet (500 mg total) by mouth 2 (two) times daily with a meal.  Dispense: 60 tablet; Refill: 1  I have done the exam and reviewed the chart and it is accurate to the best of my knowledge. Development worker, community has been used and  any errors in dictation or transcription are unintentional. Miguel Aschoff M.D. [facility]

## 2018-01-10 ENCOUNTER — Other Ambulatory Visit: Payer: Self-pay | Admitting: Family Medicine

## 2018-01-10 DIAGNOSIS — M109 Gout, unspecified: Secondary | ICD-10-CM

## 2018-02-14 ENCOUNTER — Other Ambulatory Visit: Payer: Self-pay | Admitting: Family Medicine

## 2018-02-24 ENCOUNTER — Other Ambulatory Visit: Payer: Self-pay | Admitting: Family Medicine

## 2018-02-24 DIAGNOSIS — M25511 Pain in right shoulder: Secondary | ICD-10-CM

## 2018-03-11 ENCOUNTER — Ambulatory Visit: Payer: Self-pay | Admitting: Family Medicine

## 2018-06-02 ENCOUNTER — Encounter: Payer: Self-pay | Admitting: Family Medicine

## 2018-06-02 ENCOUNTER — Ambulatory Visit: Payer: Commercial Managed Care - PPO | Admitting: Family Medicine

## 2018-06-02 VITALS — BP 129/67 | HR 74 | Temp 97.5°F | Wt 220.4 lb

## 2018-06-02 DIAGNOSIS — G4700 Insomnia, unspecified: Secondary | ICD-10-CM | POA: Diagnosis not present

## 2018-06-02 DIAGNOSIS — E7800 Pure hypercholesterolemia, unspecified: Secondary | ICD-10-CM

## 2018-06-02 DIAGNOSIS — I10 Essential (primary) hypertension: Secondary | ICD-10-CM

## 2018-06-02 NOTE — Progress Notes (Signed)
Patient: Joe Blankenship Male    DOB: June 19, 1950   68 y.o.   MRN: [id number] Visit Date: 06/02/2018  Today's Provider: [name], MD   Chief Complaint  Patient presents with  . Shoulder Pain   Subjective:    HPI  Shoulder Pain Patient presents today for right shoulder pain follow-up. Patient was referred to ortho at his last appointment. Also he was prescribed naproxen 500 mg tablets. Patient states his shoulder pain has improved with the help of the naproxen.  He has chronic insomnia.   No Known Allergies   Current Outpatient Medications:  .  allopurinol (ZYLOPRIM) 300 MG tablet, TAKE 1 TABLET (300 MG TOTAL) BY MOUTH DAILY., Disp: 30 tablet, Rfl: 11 .  aspirin 81 MG tablet, Take by mouth daily., Disp: , Rfl:  .  Colchicine 0.6 MG CAPS, Take 0.6 mg by mouth 2 (two) times daily., Disp: 60 capsule, Rfl: 5 .  escitalopram (LEXAPRO) 5 MG tablet, TAKE 1 TABLET BY MOUTH EVERY DAY, Disp: 30 tablet, Rfl: 1 .  Melatonin 10 MG TABS, Take 10 mg by mouth at bedtime as needed., Disp: 30 tablet, Rfl: 12 .  naproxen (NAPROSYN) 500 MG tablet, TAKE 1 TABLET (500 MG TOTAL) BY MOUTH 2 (TWO) TIMES DAILY WITH A MEAL., Disp: 60 tablet, Rfl: 1 .  Olmesartan-amLODIPine-HCTZ 40-10-25 MG TABS, TAKE 1 TABLET BY MOUTH DAILY, Disp: 30 tablet, Rfl: 12 .  pravastatin (PRAVACHOL) 40 MG tablet, TAKE 1 TABLET BY MOUTH EVERY DAY, Disp: 30 tablet, Rfl: 11 .  ranitidine (ZANTAC) 150 MG tablet, Take 150 mg by mouth at bedtime., Disp: , Rfl:   Review of Systems  Constitutional: Negative.   HENT: Negative.   Respiratory: Negative.   Cardiovascular: Negative.   Gastrointestinal: Negative.   Endocrine: Negative.   Genitourinary: Negative.   Allergic/Immunologic: Negative.   Neurological: Negative.   Hematological: Negative.   Psychiatric/Behavioral: Negative.     Social History   Tobacco Use  . Smoking status: Former Smoker    Years: 14.00    Last attempt to quit: 05/18/1977    Years since  quitting: 41.0  . Smokeless tobacco: Never Used  Substance Use Topics  . Alcohol use: Yes    Alcohol/week: 3.0 standard drinks    Types: 3 Cans of beer per week      Objective:   BP 129/67 (BP Location: Left Arm, Patient Position: Sitting, Cuff Size: Normal)   Pulse 74   Temp (!) 97.5 F (36.4 C) (Oral)   Wt 220 lb 6.4 oz (100 kg)   SpO2 95%   BMI 31.62 kg/m  Vitals:   06/02/18 0840  BP: 129/67  Pulse: 74  Temp: (!) 97.5 F (36.4 C)  TempSrc: Oral  SpO2: 95%  Weight: 220 lb 6.4 oz (100 kg)     Physical Exam Constitutional:      Appearance: He is well-developed.  HENT:     Head: Normocephalic and atraumatic.     Right Ear: External ear normal.     Left Ear: External ear normal.     Nose: Nose normal.  Eyes:     Conjunctiva/sclera: Conjunctivae normal.     Pupils: Pupils are equal, round, and reactive to light.  Neck:     Musculoskeletal: Normal range of motion and neck supple.  Cardiovascular:     Rate and Rhythm: Normal rate and regular rhythm.     Heart sounds: Normal heart sounds.  Pulmonary:     Effort:  Pulmonary effort is normal.     Breath sounds: Normal breath sounds.  Abdominal:     General: Bowel sounds are normal.     Palpations: Abdomen is soft.  Genitourinary:    Rectum: Normal.  Musculoskeletal: Normal range of motion.     Comments: Old Right AC separation.Trace edema.  Skin:    General: Skin is warm and dry.  Neurological:     General: No focal deficit present.     Mental Status: He is alert and oriented to person, place, and time. Mental status is at baseline.  Psychiatric:        Behavior: Behavior normal.        Thought Content: Thought content normal.        Judgment: Judgment normal.         Assessment & Plan      1. Insomnia, unspecified type  - zolpidem (AMBIEN) 10 MG tablet; Take 1 tablet (10 mg total) by mouth at bedtime as needed for up to 30 days for sleep.  Dispense: 30 tablet; Refill: 3  2. Essential (primary)  hypertension Return to clinic 6 months.  3. Pure hypercholesterolemia    I, Porsha McClurkin, CMA, am acting as a scribe for [name], MD.    I have done the exam and reviewed the chart and it is accurate to the best of my knowledge. Development worker, community has been used and  any errors in dictation or transcription are unintentional. Miguel Aschoff M.D. [place], MD  [facility]

## 2018-06-03 ENCOUNTER — Telehealth: Payer: Self-pay | Admitting: Family Medicine

## 2018-06-03 NOTE — Telephone Encounter (Signed)
Pt states he was seen on 06/02/2018 and was told a sleeping aide would be called in for him.  States the medication has not been called in for him.  He is requesting it be sent to [facility] in [place]

## 2018-06-03 NOTE — Telephone Encounter (Signed)
Dr Rosanna Randy can you sign off on his Zolpidem please. Thanks

## 2018-06-04 NOTE — Telephone Encounter (Signed)
Patient called back stating that this medication still hasn't been sent into the pharmacy yet. Patient is waiting for this prescription. Please sign off on medication.

## 2018-06-04 NOTE — Telephone Encounter (Signed)
  Dr Rosanna Randy Could you please sign off on the Zolpidem from his visit?   Thanks E

## 2018-06-10 MED ORDER — ZOLPIDEM TARTRATE 10 MG PO TABS: 10.0000 mg | ORAL_TABLET | Freq: Every evening | ORAL | 3 refills | Status: DC | PRN

## 2018-06-10 NOTE — Telephone Encounter (Signed)
Signed--sorry I missed it.

## 2018-06-10 NOTE — Telephone Encounter (Signed)
Pt called back asking if this could be sent in as soon as possible. States if Dr. Rosanna Randy isn't willing to sign off if someone would call him back and let him know so he will know.

## 2018-06-10 NOTE — Telephone Encounter (Signed)
Please review

## 2018-09-15 ENCOUNTER — Encounter: Payer: Self-pay | Admitting: Family Medicine

## 2018-09-15 ENCOUNTER — Other Ambulatory Visit: Payer: Self-pay

## 2018-09-15 ENCOUNTER — Ambulatory Visit: Payer: Commercial Managed Care - PPO | Admitting: Family Medicine

## 2018-09-15 VITALS — BP 138/82 | HR 76 | Temp 98.3°F | Resp 16 | Ht 70.0 in | Wt 224.0 lb

## 2018-09-15 DIAGNOSIS — M7751 Other enthesopathy of right foot: Secondary | ICD-10-CM | POA: Diagnosis not present

## 2018-09-15 DIAGNOSIS — C439 Malignant melanoma of skin, unspecified: Secondary | ICD-10-CM

## 2018-09-15 DIAGNOSIS — M25561 Pain in right knee: Secondary | ICD-10-CM | POA: Diagnosis not present

## 2018-09-15 DIAGNOSIS — G4733 Obstructive sleep apnea (adult) (pediatric): Secondary | ICD-10-CM

## 2018-09-15 DIAGNOSIS — I10 Essential (primary) hypertension: Secondary | ICD-10-CM

## 2018-09-15 DIAGNOSIS — M109 Gout, unspecified: Secondary | ICD-10-CM

## 2018-09-15 MED ORDER — PREDNISONE 10 MG (21) PO TBPK: ORAL_TABLET | ORAL | 1 refills | Status: DC

## 2018-09-15 NOTE — Progress Notes (Signed)
Patient: Joe Blankenship Male    DOB: [birth year]   68 y.o.   MRN: [id number] Visit Date: 09/15/2018  Today's Provider: [name], MD   Chief Complaint  Patient presents with  . Leg Pain   Subjective:     Leg Pain   The incident occurred 5 to 7 days ago (7 days ago). There was no injury mechanism. The pain is present in the right leg. The quality of the pain is described as aching, shooting and stabbing. The pain is moderate (can be severe). The pain has been constant since onset. Associated symptoms include an inability to bear weight and muscle weakness. Pertinent negatives include no numbness or tingling. The symptoms are aggravated by movement and weight bearing. He has tried NSAIDs and elevation for the symptoms. The treatment provided no relief.  Has had no known injury.  It is been hurting for 1 week.  Says it does not feel like gout that he has had in the past.  Hurts in the right posterior knee to the ankle.  Does not go into the foot.  There is no weakness.  It is worse with weightbearing.  It does get better later in the day.  No Known Allergies   Current Outpatient Medications:  .  allopurinol (ZYLOPRIM) 300 MG tablet, TAKE 1 TABLET (300 MG TOTAL) BY MOUTH DAILY., Disp: 30 tablet, Rfl: 11 .  aspirin 81 MG tablet, Take by mouth daily., Disp: , Rfl:  .  Colchicine 0.6 MG CAPS, Take 0.6 mg by mouth 2 (two) times daily., Disp: 60 capsule, Rfl: 5 .  escitalopram (LEXAPRO) 5 MG tablet, TAKE 1 TABLET BY MOUTH EVERY DAY, Disp: 30 tablet, Rfl: 1 .  Melatonin 10 MG TABS, Take 10 mg by mouth at bedtime as needed., Disp: 30 tablet, Rfl: 12 .  naproxen (NAPROSYN) 500 MG tablet, TAKE 1 TABLET (500 MG TOTAL) BY MOUTH 2 (TWO) TIMES DAILY WITH A MEAL., Disp: 60 tablet, Rfl: 1 .  Olmesartan-amLODIPine-HCTZ 40-10-25 MG TABS, TAKE 1 TABLET BY MOUTH DAILY, Disp: 30 tablet, Rfl: 12 .  pravastatin (PRAVACHOL) 40 MG tablet, TAKE 1 TABLET BY MOUTH EVERY DAY, Disp: 30 tablet, Rfl: 11 .   ranitidine (ZANTAC) 150 MG tablet, Take 150 mg by mouth at bedtime., Disp: , Rfl:  .  zolpidem (AMBIEN) 10 MG tablet, Take 1 tablet (10 mg total) by mouth at bedtime as needed for up to 30 days for sleep., Disp: 30 tablet, Rfl: 3  Review of Systems  Constitutional: Positive for activity change. Negative for fatigue and fever.  Eyes: Positive for discharge.  Respiratory: Negative.   Cardiovascular: Negative for leg swelling.  Endocrine: Negative.   Musculoskeletal: Positive for arthralgias, gait problem and myalgias.  Skin: Negative for color change, pallor, rash and wound.  Allergic/Immunologic: Negative.   Neurological: Negative for tingling and numbness.  Psychiatric/Behavioral: Negative.     Social History   Tobacco Use  . Smoking status: Former Smoker    Years: 14.00    Last attempt to quit: 05/18/1977    Years since quitting: 41.3  . Smokeless tobacco: Never Used  Substance Use Topics  . Alcohol use: Yes    Alcohol/week: 3.0 standard drinks    Types: 3 Cans of beer per week      Objective:   BP 138/82 (BP Location: Left Arm, Patient Position: Sitting, Cuff Size: Normal)   Pulse 76   Temp 98.3 F (36.8 C)   Resp 16  Ht 5\' 10"  (1.778 m)   Wt 224 lb (101.6 kg)   SpO2 98%   BMI 32.14 kg/m  Vitals:   09/15/18 0928  BP: 138/82  Pulse: 76  Resp: 16  Temp: 98.3 F (36.8 C)  SpO2: 98%  Weight: 224 lb (101.6 kg)  Height: 5\' 10"  (1.778 m)     Physical Exam Vitals signs reviewed.  Constitutional:      Appearance: He is well-developed.  HENT:     Head: Normocephalic and atraumatic.     Right Ear: External ear normal.     Left Ear: External ear normal.     Nose: Nose normal.  Eyes:     Conjunctiva/sclera: Conjunctivae normal.     Pupils: Pupils are equal, round, and reactive to light.  Neck:     Musculoskeletal: Normal range of motion and neck supple.  Cardiovascular:     Rate and Rhythm: Normal rate and regular rhythm.     Heart sounds: Normal heart  sounds.  Pulmonary:     Effort: Pulmonary effort is normal.     Breath sounds: Normal breath sounds.  Abdominal:     General: Bowel sounds are normal.     Palpations: Abdomen is soft.  Musculoskeletal: Normal range of motion.     Comments: Minimal tenderness in the right posterior portion of the knee.  Motion.  No anterior tenderness no joint line tenderness.  Also tenderness over the calcaneus at the insertion of the Achilles.  Skin:    General: Skin is warm and dry.  Neurological:     General: No focal deficit present.     Mental Status: He is alert and oriented to person, place, and time. Mental status is at baseline.  Psychiatric:        Mood and Affect: Mood normal.        Behavior: Behavior normal.        Thought Content: Thought content normal.        Judgment: Judgment normal.         Assessment & Plan    1. Right calcaneal bursitis Signs of infection.  No imaging needed at this time. - predniSONE (STERAPRED UNI-PAK 21 TAB) 10 MG (21) TBPK tablet; Taper as directed.  Dispense: 21 tablet; Refill: 1  2. Acute pain of right knee No known injury.  No swelling.  No signs of DVT. - predniSONE (STERAPRED UNI-PAK 21 TAB) 10 MG (21) TBPK tablet; Taper as directed.  Dispense: 21 tablet; Refill: 1  3. Essential (primary) hypertension Controlled.  4. Obstructive apnea CPAP.  5. Gouty arthritis This does not look like gout  6. Malignant melanoma, unspecified site [facility]) History of melanoma.  Will image if this does not resolve in the next couple of weeks.  I have done the exam and reviewed the chart and it is accurate to the best of my knowledge. Development worker, community has been used and  any errors in dictation or transcription are unintentional. Miguel Aschoff M.D. [place], MD  [facility]

## 2018-09-23 ENCOUNTER — Encounter: Payer: Self-pay | Admitting: Family Medicine

## 2018-10-16 ENCOUNTER — Other Ambulatory Visit: Payer: Self-pay | Admitting: Family Medicine

## 2018-12-22 ENCOUNTER — Other Ambulatory Visit: Payer: Self-pay | Admitting: Family Medicine

## 2018-12-22 DIAGNOSIS — M25561 Pain in right knee: Secondary | ICD-10-CM

## 2018-12-22 DIAGNOSIS — M7751 Other enthesopathy of right foot: Secondary | ICD-10-CM

## 2018-12-22 NOTE — Telephone Encounter (Signed)
ok 

## 2018-12-22 NOTE — Telephone Encounter (Signed)
° °  Tendonitis/burcitis on heal is flaring up. Pt is out of the predniSONE (STERAPRED UNI-PAK 21 TAB) 10 MG (21) TBPK tablet.  Pt needing a refill on this called into:  [id number] - [place], [place] - [address] [phone] (Phone) [phone] (Fax)   Thanks, [employer]

## 2018-12-22 NOTE — Telephone Encounter (Signed)
Pt calling back to check on the status.    Thanks, [employer]

## 2018-12-22 NOTE — Telephone Encounter (Signed)
Please review. Thanks!  

## 2018-12-23 MED ORDER — PREDNISONE 10 MG (21) PO TBPK: ORAL_TABLET | ORAL | 1 refills | Status: DC

## 2018-12-23 NOTE — Telephone Encounter (Signed)
Pt called back about getting Rx for predniSONE (STERAPRED UNI-PAK 21 TAB) 10 MG (21) TBPK tablet sent to [facility]. Please advise. Thanks TNP

## 2018-12-23 NOTE — Telephone Encounter (Signed)
RX sent to [facility].    Thanks,   -Mickel Baas

## 2019-01-06 ENCOUNTER — Other Ambulatory Visit: Payer: Self-pay

## 2019-01-06 ENCOUNTER — Ambulatory Visit (INDEPENDENT_AMBULATORY_CARE_PROVIDER_SITE_OTHER): Payer: Commercial Managed Care - PPO | Admitting: Family Medicine

## 2019-01-06 ENCOUNTER — Encounter: Payer: Self-pay | Admitting: Family Medicine

## 2019-01-06 VITALS — BP 134/70 | HR 80 | Temp 97.9°F | Resp 16 | Ht 70.5 in | Wt 210.0 lb

## 2019-01-06 DIAGNOSIS — G4733 Obstructive sleep apnea (adult) (pediatric): Secondary | ICD-10-CM

## 2019-01-06 DIAGNOSIS — E7800 Pure hypercholesterolemia, unspecified: Secondary | ICD-10-CM

## 2019-01-06 DIAGNOSIS — I10 Essential (primary) hypertension: Secondary | ICD-10-CM | POA: Diagnosis not present

## 2019-01-06 DIAGNOSIS — C439 Malignant melanoma of skin, unspecified: Secondary | ICD-10-CM

## 2019-01-06 DIAGNOSIS — R5383 Other fatigue: Secondary | ICD-10-CM | POA: Diagnosis not present

## 2019-01-06 DIAGNOSIS — Z0000 Encounter for general adult medical examination without abnormal findings: Secondary | ICD-10-CM

## 2019-01-06 NOTE — Progress Notes (Signed)
Patient: Joe Blankenship, Male    DOB: May 23, 1950, 68 y.o.   MRN: [id number] Visit Date: 01/06/2019  Today's Provider: [name], MD   Chief Complaint  Patient presents with  . Annual Exam   Subjective:     Annual physical exam Joe Blankenship is a 68 y.o. male who presents today for health maintenance and complete physical. He feels fairly well. He reports exercising not regularly, but he does stay active. He reports he is sleeping well. Pt planning to retire end of 2020.  Colonoscopy- 04/17/2016. Hyperplastic polyps. Repeat 3 yrs.  Immunization History  Administered Date(s) Administered  . Pneumococcal Conjugate-13 01/17/2015  . Pneumococcal Polysaccharide-23 03/05/2016  . Tdap 01/11/2013  . Zoster 01/11/2013    Review of Systems  Constitutional: Positive for fatigue.       Tired over the past month. He snores with no known apnea.  HENT: Negative.   Eyes: Negative.   Respiratory: Negative.   Cardiovascular: Negative.   Gastrointestinal: Negative.   Endocrine: Negative.   Genitourinary: Negative.   Musculoskeletal: Negative.   Allergic/Immunologic: Negative.   Neurological: Negative.   Hematological: Negative.   Psychiatric/Behavioral: Negative.     Social History      He  reports that he quit smoking about 41 years ago. He quit after 14.00 years of use. He has never used smokeless tobacco. He reports current alcohol use of about 3.0 standard drinks of alcohol per week. He reports that he does not use drugs.       Social History   Socioeconomic History  . Marital status: Single    Spouse name: Not on file  . Number of children: 0  . Years of education: Not on file  . Highest education level: Not on file  Occupational History    Employer: [employer]  . Financial resource strain: Not on file  . Food insecurity    Worry: Not on file    Inability: Not on file  . Transportation needs    Medical: Not on file    Non-medical: Not on file   Tobacco Use  . Smoking status: Former Smoker    Years: 14.00    Quit date: 05/18/1977    Years since quitting: 41.6  . Smokeless tobacco: Never Used  Substance and Sexual Activity  . Alcohol use: Yes    Alcohol/week: 3.0 standard drinks    Types: 3 Cans of beer per week  . Drug use: No  . Sexual activity: Not Currently  Lifestyle  . Physical activity    Days per week: Not on file    Minutes per session: Not on file  . Stress: Not on file  Relationships  . Social Herbalist on phone: Not on file    Gets together: Not on file    Attends religious service: Not on file    Active member of club or organization: Not on file    Attends meetings of clubs or organizations: Not on file    Relationship status: Not on file  Other Topics Concern  . Not on file  Social History Narrative  . Not on file    Past Medical History:  Diagnosis Date  . GERD (gastroesophageal reflux disease)   . Gout   . Hyperlipidemia   . Hypertension   . Melanoma ([place])   . Melanoma ([place])   . Obstructive sleep apnea   . Tubular adenoma  Patient Active Problem List   Diagnosis Date Noted  . Hyperglycemia 03/11/2017  . Gouty arthritis 03/05/2016  . Insomnia 03/05/2016  . Basal cell carcinoma 11/11/2014  . Essential (primary) hypertension 11/11/2014  . Malignant melanoma ([place]) 11/11/2014  . Obstructive apnea 11/11/2014  . HLD (hyperlipidemia) 11/11/2014    Past Surgical History:  Procedure Laterality Date  . BASAL CELL CARCINOMA EXCISION    . COLONOSCOPY WITH PROPOFOL N/A 04/17/2016   Procedure: COLONOSCOPY WITH PROPOFOL;  Surgeon: Jonathon Bellows, MD;  Location: [facility] ENDOSCOPY;  Service: Endoscopy;  Laterality: N/A;  . MELANOMA EXCISION     x 2    Family History        Family Status  Relation Name Status  . Mother  Alive  . Father  Deceased at age 69        His family history includes Heart attack in his father; Heart disease in his father; Hyperlipidemia in his father;  Hypertension in his father; Pancreatitis in his mother.      No Known Allergies   Current Outpatient Medications:  .  allopurinol (ZYLOPRIM) 300 MG tablet, TAKE 1 TABLET (300 MG TOTAL) BY MOUTH DAILY., Disp: 30 tablet, Rfl: 11 .  aspirin 81 MG tablet, Take by mouth daily., Disp: , Rfl:  .  Colchicine 0.6 MG CAPS, Take 0.6 mg by mouth 2 (two) times daily., Disp: 60 capsule, Rfl: 5 .  escitalopram (LEXAPRO) 5 MG tablet, TAKE 1 TABLET BY MOUTH EVERY DAY, Disp: 30 tablet, Rfl: 1 .  Melatonin 10 MG TABS, Take 10 mg by mouth at bedtime as needed., Disp: 30 tablet, Rfl: 12 .  naproxen (NAPROSYN) 500 MG tablet, TAKE 1 TABLET (500 MG TOTAL) BY MOUTH 2 (TWO) TIMES DAILY WITH A MEAL., Disp: 60 tablet, Rfl: 1 .  Olmesartan-amLODIPine-HCTZ 40-10-25 MG TABS, TAKE 1 TABLET BY MOUTH DAILY, Disp: 30 tablet, Rfl: 12 .  pravastatin (PRAVACHOL) 40 MG tablet, TAKE 1 TABLET BY MOUTH EVERY DAY, Disp: 30 tablet, Rfl: 11 .  predniSONE (STERAPRED UNI-PAK 21 TAB) 10 MG (21) TBPK tablet, Taper as directed., Disp: 21 tablet, Rfl: 1 .  ranitidine (ZANTAC) 150 MG tablet, Take 150 mg by mouth at bedtime., Disp: , Rfl:  .  zolpidem (AMBIEN) 10 MG tablet, Take 1 tablet (10 mg total) by mouth at bedtime as needed for up to 30 days for sleep., Disp: 30 tablet, Rfl: 3   Patient Care Team: Jerrol Banana., MD as PCP - General (Family Medicine)    Objective:    Vitals: BP 134/70   Pulse 80   Temp 97.9 F (36.6 C)   Resp 16   Ht 5' 10.5" (1.791 m)   Wt 210 lb (95.3 kg)   SpO2 97%   BMI 29.71 kg/m    Vitals:   01/06/19 0956  BP: 134/70  Pulse: 80  Resp: 16  Temp: 97.9 F (36.6 C)  SpO2: 97%  Weight: 210 lb (95.3 kg)  Height: 5' 10.5" (1.791 m)     Physical Exam Vitals signs reviewed.  Constitutional:      Appearance: He is well-developed.  HENT:     Head: Normocephalic and atraumatic.     Right Ear: External ear normal.     Left Ear: External ear normal.     Nose: Nose normal.  Eyes:      Conjunctiva/sclera: Conjunctivae normal.     Pupils: Pupils are equal, round, and reactive to light.  Neck:     Musculoskeletal: Normal range  of motion and neck supple.  Cardiovascular:     Rate and Rhythm: Normal rate and regular rhythm.     Heart sounds: Normal heart sounds.  Pulmonary:     Effort: Pulmonary effort is normal.     Breath sounds: Normal breath sounds.  Abdominal:     General: Bowel sounds are normal.     Palpations: Abdomen is soft.  Genitourinary:    Penis: Normal.      Scrotum/Testes: Normal.     Prostate: Normal.     Rectum: Normal.  Musculoskeletal: Normal range of motion.     Comments: Old Right AC separation.Trace edema.  Skin:    General: Skin is warm and dry.  Neurological:     General: No focal deficit present.     Mental Status: He is alert and oriented to person, place, and time.  Psychiatric:        Mood and Affect: Mood normal.        Behavior: Behavior normal.        Thought Content: Thought content normal.        Judgment: Judgment normal.   [place] is 2.   Depression Screen PHQ 2/9 Scores 01/06/2019 10/19/2017 09/09/2017 03/11/2017  PHQ - 2 Score 0 3 0 0  PHQ- 9 Score 4 9 - 5       Assessment & Plan:     Routine Health Maintenance and Physical Exam  Exercise Activities and Dietary recommendations Goals   None     Immunization History  Administered Date(s) Administered  . Pneumococcal Conjugate-13 01/17/2015  . Pneumococcal Polysaccharide-23 03/05/2016  . Tdap 01/11/2013  . Zoster 01/11/2013    Health Maintenance  Topic Date Due  . INFLUENZA VACCINE  12/18/2018  . COLONOSCOPY  04/18/2019  . TETANUS/TDAP  01/12/2023  . Hepatitis C Screening  Completed  . PNA vac Low Risk Adult  Completed     Discussed health benefits of physical activity, and encouraged him to engage in regular exercise appropriate for his age and condition.  1. Annual physical exam  - Comprehensive metabolic panel - Lipid panel  2. Fatigue,  unspecified type Needs ECG. Does not sound cardiac. Possible OSA,consider testosterone although I would be reluctant to treat 68yo. - EKG 12-Lead - CBC with Differential/Platelet - TSH  3. Essential (primary) hypertension  On ARB/CCB/HCT 4. Pure hypercholesterolemia Pravachol.  5. Malignant melanoma, unspecified site [facility]) Per Dr Evorn Gong.  6. Obstructive apnea RTC 1-2 months.     Shandee Jergens [place], MD  [facility]

## 2019-01-07 LAB — COMPREHENSIVE METABOLIC PANEL
ALT: 57 IU/L — ABNORMAL HIGH (ref 0–44)
AST: 29 IU/L (ref 0–40)
Albumin/Globulin Ratio: 1.9 (ref 1.2–2.2)
Albumin: 4.6 g/dL (ref 3.8–4.8)
Alkaline Phosphatase: 80 IU/L (ref 39–117)
BUN/Creatinine Ratio: 19 (ref 10–24)
BUN: 17 mg/dL (ref 8–27)
Bilirubin Total: 0.5 mg/dL (ref 0.0–1.2)
CO2: 22 mmol/L (ref 20–29)
Calcium: 9.7 mg/dL (ref 8.6–10.2)
Chloride: 98 mmol/L (ref 96–106)
Creatinine, Ser: 0.91 mg/dL (ref 0.76–1.27)
GFR calc Af Amer: 98 mL/min/{1.73_m2} (ref >59)
GFR calc non Af Amer: 85 mL/min/{1.73_m2} (ref >59)
Globulin, Total: 2.4 g/dL (ref 1.5–4.5)
Glucose: 103 mg/dL — ABNORMAL HIGH (ref 65–99)
Potassium: 4.7 mmol/L (ref 3.5–5.2)
Sodium: 137 mmol/L (ref 134–144)
Total Protein: 7 g/dL (ref 6.0–8.5)

## 2019-01-07 LAB — TSH: TSH: 0.72 u[IU]/mL (ref 0.450–4.500)

## 2019-01-07 LAB — CBC WITH DIFFERENTIAL/PLATELET
Basophils Absolute: 0.1 10*3/uL (ref 0.0–0.2)
Basos: 1 %
EOS (ABSOLUTE): 0.1 10*3/uL (ref 0.0–0.4)
Eos: 1 %
Hematocrit: 47 % (ref 37.5–51.0)
Hemoglobin: 15.6 g/dL (ref 13.0–17.7)
Immature Grans (Abs): 0.1 10*3/uL (ref 0.0–0.1)
Immature Granulocytes: 1 %
Lymphocytes Absolute: 2.9 10*3/uL (ref 0.7–3.1)
Lymphs: 24 %
MCH: 33.1 pg — ABNORMAL HIGH (ref 26.6–33.0)
MCHC: 33.2 g/dL (ref 31.5–35.7)
MCV: 100 fL — ABNORMAL HIGH (ref 79–97)
Monocytes Absolute: 1.1 10*3/uL — ABNORMAL HIGH (ref 0.1–0.9)
Monocytes: 9 %
Neutrophils Absolute: 7.8 10*3/uL — ABNORMAL HIGH (ref 1.4–7.0)
Neutrophils: 64 %
Platelets: 304 10*3/uL (ref 150–450)
RBC: 4.72 x10E6/uL (ref 4.14–5.80)
RDW: 13.1 % (ref 11.6–15.4)
WBC: 12.1 10*3/uL — ABNORMAL HIGH (ref 3.4–10.8)

## 2019-01-07 LAB — LIPID PANEL
Chol/HDL Ratio: 4 ratio (ref 0.0–5.0)
Cholesterol, Total: 222 mg/dL — ABNORMAL HIGH (ref 100–199)
HDL: 56 mg/dL (ref >39)
LDL Calculated: 87 mg/dL (ref 0–99)
Triglycerides: 397 mg/dL — ABNORMAL HIGH (ref 0–149)
VLDL Cholesterol Cal: 79 mg/dL — ABNORMAL HIGH (ref 5–40)

## 2019-01-13 ENCOUNTER — Telehealth: Payer: Self-pay

## 2019-01-13 NOTE — Telephone Encounter (Signed)
-----   Message from Jerrol Banana., MD sent at 01/12/2019  3:57 PM EDT ----- Labs stable.

## 2019-01-13 NOTE — Telephone Encounter (Signed)
Patient advised.

## 2019-01-13 NOTE — Telephone Encounter (Signed)
Left message to call back  

## 2019-01-16 ENCOUNTER — Other Ambulatory Visit: Payer: Self-pay | Admitting: Family Medicine

## 2019-01-16 DIAGNOSIS — M109 Gout, unspecified: Secondary | ICD-10-CM

## 2019-02-08 ENCOUNTER — Telehealth: Payer: Self-pay

## 2019-02-08 NOTE — Telephone Encounter (Signed)
LMTCB to schedule telephonic AWV. CPE completed 01/06/19. Wellness code not used so pt is eligible for this apt.

## 2019-02-16 ENCOUNTER — Other Ambulatory Visit: Payer: Self-pay | Admitting: Family Medicine

## 2019-02-16 NOTE — Telephone Encounter (Signed)
Pt returned call. Pt is scheduled for telephone AWV on 02/22/2019 @ 11 am. The system wouldn't allow me to select AWV for the visit type so I selected Physical and put in the appt notes that it is for AWV. Thanks TNP

## 2019-02-16 NOTE — Telephone Encounter (Signed)
Apt type changed. Thanks, MM

## 2019-02-21 NOTE — Progress Notes (Signed)
Subjective:   Joe Blankenship is a 68 y.o. male who presents for Medicare Annual/Subsequent preventive examination.    This visit is being conducted through telemedicine due to the COVID-19 pandemic. This patient has given me verbal consent via doximity to conduct this visit, patient states they are participating from their home address. Some vital signs may be absent or patient reported.    Patient identification: identified by name, DOB, and current address  Review of Systems:  N/A        Objective:    Vitals: There were no vitals taken for this visit.  There is no height or weight on file to calculate BMI. Unable to obtain vitals due to visit being conducted via telephonically.   Advanced Directives 03/11/2017 04/17/2016 03/05/2016 01/17/2015  Does Patient Have a Medical Advance Directive? No No No No  Would patient like information on creating a medical advance directive? - No - Patient declined - -    Tobacco Social History   Tobacco Use  Smoking Status Former Smoker  . Years: 14.00  . Quit date: 05/18/1977  . Years since quitting: 41.7  Smokeless Tobacco Never Used     Counseling given: Not Answered   Clinical Intake:                       Past Medical History:  Diagnosis Date  . GERD (gastroesophageal reflux disease)   . Gout   . Hyperlipidemia   . Hypertension   . Melanoma ([place])   . Melanoma ([place])   . Obstructive sleep apnea   . Tubular adenoma    Past Surgical History:  Procedure Laterality Date  . BASAL CELL CARCINOMA EXCISION    . COLONOSCOPY WITH PROPOFOL N/A 04/17/2016   Procedure: COLONOSCOPY WITH PROPOFOL;  Surgeon: Jonathon Bellows, MD;  Location: [facility] ENDOSCOPY;  Service: Endoscopy;  Laterality: N/A;  . MELANOMA EXCISION     x 2   Family History  Problem Relation Age of Onset  . Pancreatitis Mother   . Heart disease Father   . Hyperlipidemia Father   . Hypertension Father   . Heart attack Father    Social History    Socioeconomic History  . Marital status: Single    Spouse name: Not on file  . Number of children: 0  . Years of education: Not on file  . Highest education level: Not on file  Occupational History    Employer: [employer]  . Financial resource strain: Not on file  . Food insecurity    Worry: Not on file    Inability: Not on file  . Transportation needs    Medical: Not on file    Non-medical: Not on file  Tobacco Use  . Smoking status: Former Smoker    Years: 14.00    Quit date: 05/18/1977    Years since quitting: 41.7  . Smokeless tobacco: Never Used  Substance and Sexual Activity  . Alcohol use: Yes    Alcohol/week: 3.0 standard drinks    Types: 3 Cans of beer per week  . Drug use: No  . Sexual activity: Not Currently  Lifestyle  . Physical activity    Days per week: Not on file    Minutes per session: Not on file  . Stress: Not on file  Relationships  . Social Herbalist on phone: Not on file    Gets together: Not on file    Attends religious service:  Not on file    Active member of club or organization: Not on file    Attends meetings of clubs or organizations: Not on file    Relationship status: Not on file  Other Topics Concern  . Not on file  Social History Narrative  . Not on file    Outpatient Encounter Medications as of 02/22/2019  Medication Sig  . allopurinol (ZYLOPRIM) 300 MG tablet TAKE 1 TABLET (300 MG TOTAL) BY MOUTH DAILY.  Marland Kitchen aspirin 81 MG tablet Take by mouth daily.  . Colchicine 0.6 MG CAPS Take 0.6 mg by mouth 2 (two) times daily.  Marland Kitchen escitalopram (LEXAPRO) 5 MG tablet TAKE 1 TABLET BY MOUTH EVERY DAY  . Melatonin 10 MG TABS Take 10 mg by mouth at bedtime as needed.  . naproxen (NAPROSYN) 500 MG tablet TAKE 1 TABLET (500 MG TOTAL) BY MOUTH 2 (TWO) TIMES DAILY WITH A MEAL.  . Olmesartan-amLODIPine-HCTZ 40-10-25 MG TABS TAKE 1 TABLET BY MOUTH DAILY  . pravastatin (PRAVACHOL) 40 MG tablet TAKE 1 TABLET BY MOUTH EVERY DAY   . predniSONE (STERAPRED UNI-PAK 21 TAB) 10 MG (21) TBPK tablet Taper as directed.  . ranitidine (ZANTAC) 150 MG tablet Take 150 mg by mouth at bedtime.  Marland Kitchen zolpidem (AMBIEN) 10 MG tablet Take 1 tablet (10 mg total) by mouth at bedtime as needed for up to 30 days for sleep.   No facility-administered encounter medications on file as of 02/22/2019.     Activities of Daily Living In your present state of health, do you have any difficulty performing the following activities: 01/06/2019  Hearing? N  Vision? N  Difficulty concentrating or making decisions? N  Walking or climbing stairs? N  Dressing or bathing? N  Doing errands, shopping? N  Some recent data might be hidden    Patient Care Team: Jerrol Banana., MD as PCP - General (Family Medicine)   Assessment:   This is a routine wellness examination for [employer].  Exercise Activities and Dietary recommendations    Goals   None     Fall Risk: Fall Risk  01/06/2019 09/09/2017 03/11/2017 03/05/2016 04/05/2015  Falls in the past year? 0 No No No No  Follow up Falls evaluation completed - - - -    FALL RISK PREVENTION PERTAINING TO THE HOME:  Any stairs in or around the home? Yes  If so, are there any without handrails? No   Home free of loose throw rugs in walkways, pet beds, electrical cords, etc? Yes  Adequate lighting in your home to reduce risk of falls? Yes   ASSISTIVE DEVICES UTILIZED TO PREVENT FALLS:  Life alert? No  Use of a cane, walker or w/c? No  Grab bars in the bathroom? No  Shower chair or bench in shower? No  Elevated toilet seat or a handicapped toilet? No   TIMED UP AND GO:  Was the test performed? No .    Depression Screen PHQ 2/9 Scores 01/06/2019 10/19/2017 09/09/2017 03/11/2017  PHQ - 2 Score 0 3 0 0  PHQ- 9 Score 4 9 - 5    Cognitive Function        Immunization History  Administered Date(s) Administered  . Pneumococcal Conjugate-13 01/17/2015  . Pneumococcal Polysaccharide-23  03/05/2016  . Tdap 01/11/2013  . Zoster 01/11/2013    Qualifies for Shingles Vaccine? Yes  Zostavax completed 01/11/13. Due for Shingrix. Education has been provided regarding the importance of this vaccine. Pt has been advised to call insurance  company to determine out of pocket expense. Advised may also receive vaccine at local pharmacy or Health Dept. Verbalized acceptance and understanding.  Tdap: Up to date  Flu Vaccine: Due for Flu vaccine. Does the patient want to receive this vaccine today?  No .   Pneumococcal Vaccine: Completed series  Screening Tests Health Maintenance  Topic Date Due  . INFLUENZA VACCINE  12/18/2018  . COLONOSCOPY  04/18/2019  . TETANUS/TDAP  01/12/2023  . Hepatitis C Screening  Completed  . PNA vac Low Risk Adult  Completed   Cancer Screenings:  Colorectal Screening: Completed 04/17/16. Repeat every 3 years. Referral to GI placed today. Pt aware the office will call re: appt.  Lung Cancer Screening: (Low Dose CT Chest recommended if Age 3-80 years, 30 pack-year currently smoking OR have quit w/in 15years.) does not qualify.   Additional Screening:  Hepatitis C Screening: Up to date  Vision Screening: Recommended annual ophthalmology exams for early detection of glaucoma and other disorders of the eye.  Dental Screening: Recommended annual dental exams for proper oral hygiene  Community Resource Referral:  CRR required this visit?  No        Plan:  I have personally reviewed and addressed the Medicare Annual Wellness questionnaire and have noted the following in the patient's chart:  A. Medical and social history B. Use of alcohol, tobacco or illicit drugs  C. Current medications and supplements D. Functional ability and status E.  Nutritional status F.  Physical activity G. Advance directives H. List of other physicians I.  Hospitalizations, surgeries, and ER visits in previous 12 months J.  [place] such as hearing and  vision if needed, cognitive and depression L. Referrals and appointments   In addition, I have reviewed and discussed with patient certain preventive protocols, quality metrics, and best practice recommendations. A written personalized care plan for preventive services as well as general preventive health recommendations were provided to patient.   Glendora Score, LPN  [id number] Nurse Health Advisor   Nurse Notes: Pt to receive a flu shot at next in office visit.

## 2019-02-22 ENCOUNTER — Ambulatory Visit: Payer: Commercial Managed Care - PPO

## 2019-02-22 ENCOUNTER — Other Ambulatory Visit: Payer: Self-pay

## 2019-02-22 DIAGNOSIS — Z1211 Encounter for screening for malignant neoplasm of colon: Secondary | ICD-10-CM

## 2019-02-22 DIAGNOSIS — Z0000 Encounter for general adult medical examination without abnormal findings: Secondary | ICD-10-CM

## 2019-02-22 NOTE — Patient Instructions (Signed)
Joe Blankenship , Thank you for taking time to come for your Medicare Wellness Visit. I appreciate your ongoing commitment to your health goals. Please review the following plan we discussed and let me know if I can assist you in the future.   Screening recommendations/referrals: Colonoscopy: Referral to GI placed today. Pt aware the office will call re: appt. Recommended yearly ophthalmology/optometry visit for glaucoma screening and checkup Recommended yearly dental visit for hygiene and checkup  Vaccinations: Influenza vaccine: Currently due Pneumococcal vaccine: Completed series Tdap vaccine: Up to date, due 12/2022 Shingles vaccine: Pt declines today.     Advanced directives: Advance directive discussed with you today. Even though you declined this today please call our office should you change your mind and we can give you the proper paperwork for you to fill out.  Conditions/risks identified: Recommend to drink at least 6-8 8oz glasses of water per day.  Next appointment: 03/01/19 @ 10:00 AM with Dr Rosanna Randy  Preventive Care 70 Years and Older, Male Preventive care refers to lifestyle choices and visits with your health care provider that can promote health and wellness. What does preventive care include?  A yearly physical exam. This is also called an annual well check.  Dental exams once or twice a year.  Routine eye exams. Ask your health care provider how often you should have your eyes checked.  Personal lifestyle choices, including:  Daily care of your teeth and gums.  Regular physical activity.  Eating a healthy diet.  Avoiding tobacco and drug use.  Limiting alcohol use.  Practicing safe sex.  Taking low doses of aspirin every day.  Taking vitamin and mineral supplements as recommended by your health care provider. What happens during an annual well check? The services and screenings done by your health care provider during your annual well check will depend on  your age, overall health, lifestyle risk factors, and family history of disease. Counseling  Your health care provider may ask you questions about your:  Alcohol use.  Tobacco use.  Drug use.  Emotional well-being.  Home and relationship well-being.  Sexual activity.  Eating habits.  History of falls.  Memory and ability to understand (cognition).  Work and work Statistician. Screening  You may have the following tests or measurements:  Height, weight, and BMI.  Blood pressure.  Lipid and cholesterol levels. These may be checked every 5 years, or more frequently if you are over 3 years old.  Skin check.  Lung cancer screening. You may have this screening every year starting at age 39 if you have a 30-pack-year history of smoking and currently smoke or have quit within the past 15 years.  Fecal occult blood test (FOBT) of the stool. You may have this test every year starting at age 67.  Flexible sigmoidoscopy or colonoscopy. You may have a sigmoidoscopy every 5 years or a colonoscopy every 10 years starting at age 78.  Prostate cancer screening. Recommendations will vary depending on your family history and other risks.  Hepatitis C blood test.  Hepatitis B blood test.  Sexually transmitted disease (STD) testing.  Diabetes screening. This is done by checking your blood sugar (glucose) after you have not eaten for a while (fasting). You may have this done every 1-3 years.  Abdominal aortic aneurysm (AAA) screening. You may need this if you are a current or former smoker.  Osteoporosis. You may be screened starting at age 55 if you are at high risk. Talk with your health care  provider about your test results, treatment options, and if necessary, the need for more tests. Vaccines  Your health care provider may recommend certain vaccines, such as:  Influenza vaccine. This is recommended every year.  Tetanus, diphtheria, and acellular pertussis (Tdap, Td) vaccine.  You may need a Td booster every 10 years.  Zoster vaccine. You may need this after age 65.  Pneumococcal 13-valent conjugate (PCV13) vaccine. One dose is recommended after age 14.  Pneumococcal polysaccharide (PPSV23) vaccine. One dose is recommended after age 38. Talk to your health care provider about which screenings and vaccines you need and how often you need them. This information is not intended to replace advice given to you by your health care provider. Make sure you discuss any questions you have with your health care provider. Document Released: 06/01/2015 Document Revised: 01/23/2016 Document Reviewed: 03/06/2015 Elsevier Interactive Patient Education  2017 [place] Prevention in the Home Falls can cause injuries. They can happen to people of all ages. There are many things you can do to make your home safe and to help prevent falls. What can I do on the outside of my home?  Regularly fix the edges of walkways and driveways and fix any cracks.  Remove anything that might make you trip as you walk through a door, such as a raised step or threshold.  Trim any bushes or trees on the path to your home.  Use bright outdoor lighting.  Clear any walking paths of anything that might make someone trip, such as rocks or tools.  Regularly check to see if handrails are loose or broken. Make sure that both sides of any steps have handrails.  Any raised decks and porches should have guardrails on the edges.  Have any leaves, snow, or ice cleared regularly.  Use sand or salt on walking paths during winter.  Clean up any spills in your garage right away. This includes oil or grease spills. What can I do in the bathroom?  Use night lights.  Install grab bars by the toilet and in the tub and shower. Do not use towel bars as grab bars.  Use non-skid mats or decals in the tub or shower.  If you need to sit down in the shower, use a plastic, non-slip stool.  Keep the  floor dry. Clean up any water that spills on the floor as soon as it happens.  Remove soap buildup in the tub or shower regularly.  Attach bath mats securely with double-sided non-slip rug tape.  Do not have throw rugs and other things on the floor that can make you trip. What can I do in the bedroom?  Use night lights.  Make sure that you have a light by your bed that is easy to reach.  Do not use any sheets or blankets that are too big for your bed. They should not hang down onto the floor.  Have a firm chair that has side arms. You can use this for support while you get dressed.  Do not have throw rugs and other things on the floor that can make you trip. What can I do in the kitchen?  Clean up any spills right away.  Avoid walking on wet floors.  Keep items that you use a lot in easy-to-reach places.  If you need to reach something above you, use a strong step stool that has a grab bar.  Keep electrical cords out of the way.  Do not use floor polish  or wax that makes floors slippery. If you must use wax, use non-skid floor wax.  Do not have throw rugs and other things on the floor that can make you trip. What can I do with my stairs?  Do not leave any items on the stairs.  Make sure that there are handrails on both sides of the stairs and use them. Fix handrails that are broken or loose. Make sure that handrails are as long as the stairways.  Check any carpeting to make sure that it is firmly attached to the stairs. Fix any carpet that is loose or worn.  Avoid having throw rugs at the top or bottom of the stairs. If you do have throw rugs, attach them to the floor with carpet tape.  Make sure that you have a light switch at the top of the stairs and the bottom of the stairs. If you do not have them, ask someone to add them for you. What else can I do to help prevent falls?  Wear shoes that:  Do not have high heels.  Have rubber bottoms.  Are comfortable and fit  you well.  Are closed at the toe. Do not wear sandals.  If you use a stepladder:  Make sure that it is fully opened. Do not climb a closed stepladder.  Make sure that both sides of the stepladder are locked into place.  Ask someone to hold it for you, if possible.  Clearly mark and make sure that you can see:  Any grab bars or handrails.  First and last steps.  Where the edge of each step is.  Use tools that help you move around (mobility aids) if they are needed. These include:  Canes.  Walkers.  Scooters.  Crutches.  Turn on the lights when you go into a dark area. Replace any light bulbs as soon as they burn out.  Set up your furniture so you have a clear path. Avoid moving your furniture around.  If any of your floors are uneven, fix them.  If there are any pets around you, be aware of where they are.  Review your medicines with your doctor. Some medicines can make you feel dizzy. This can increase your chance of falling. Ask your doctor what other things that you can do to help prevent falls. This information is not intended to replace advice given to you by your health care provider. Make sure you discuss any questions you have with your health care provider. Document Released: 03/01/2009 Document Revised: 10/11/2015 Document Reviewed: 06/09/2014 Elsevier Interactive Patient Education  2017 [employer].

## 2019-03-01 ENCOUNTER — Encounter: Payer: Self-pay | Admitting: Family Medicine

## 2019-03-01 ENCOUNTER — Ambulatory Visit: Payer: Commercial Managed Care - PPO | Admitting: Family Medicine

## 2019-03-01 ENCOUNTER — Other Ambulatory Visit: Payer: Self-pay

## 2019-03-01 VITALS — BP 134/76 | HR 72 | Temp 98.4°F | Resp 16 | Ht 71.0 in | Wt 208.0 lb

## 2019-03-01 DIAGNOSIS — G4700 Insomnia, unspecified: Secondary | ICD-10-CM | POA: Diagnosis not present

## 2019-03-01 DIAGNOSIS — I10 Essential (primary) hypertension: Secondary | ICD-10-CM

## 2019-03-01 MED ORDER — ZOLPIDEM TARTRATE 10 MG PO TABS: 10.0000 mg | ORAL_TABLET | Freq: Every evening | ORAL | 3 refills | Status: DC | PRN

## 2019-03-01 NOTE — Progress Notes (Signed)
Patient: Joe Blankenship Male    DOB: Sep 19, 1950   68 y.o.   MRN: [id number] Visit Date: 03/01/2019  Today's Provider: [name], MD   Chief Complaint  Patient presents with  . Follow-up   Subjective:     HPI  2 month follow up. Patient here for an EKG. During his CPE, the machine was not working. He feels well today with no other complaints.  He plans to retire sometime next year moved to [place].  He feels well and is doing well. BP Readings from Last 3 Encounters:  03/01/19 134/76  01/06/19 134/70  09/15/18 138/82   Wt Readings from Last 3 Encounters:  03/01/19 208 lb (94.3 kg)  01/06/19 210 lb (95.3 kg)  09/15/18 224 lb (101.6 kg)   Patient is also requesting a refill on sleep medications. He takes Ambien 10mg  as needed.   No Known Allergies   Current Outpatient Medications:  .  allopurinol (ZYLOPRIM) 300 MG tablet, TAKE 1 TABLET (300 MG TOTAL) BY MOUTH DAILY., Disp: 30 tablet, Rfl: 11 .  aspirin 81 MG tablet, Take by mouth daily., Disp: , Rfl:  .  Colchicine 0.6 MG CAPS, Take 0.6 mg by mouth 2 (two) times daily., Disp: 60 capsule, Rfl: 5 .  escitalopram (LEXAPRO) 5 MG tablet, TAKE 1 TABLET BY MOUTH EVERY DAY, Disp: 30 tablet, Rfl: 1 .  Melatonin 10 MG TABS, Take 10 mg by mouth at bedtime as needed., Disp: 30 tablet, Rfl: 12 .  Olmesartan-amLODIPine-HCTZ 40-10-25 MG TABS, TAKE 1 TABLET BY MOUTH DAILY, Disp: 30 tablet, Rfl: 12 .  omeprazole (PRILOSEC) 20 MG capsule, Take 20 mg by mouth daily., Disp: , Rfl:  .  pravastatin (PRAVACHOL) 40 MG tablet, TAKE 1 TABLET BY MOUTH EVERY DAY, Disp: 30 tablet, Rfl: 11 .  ranitidine (ZANTAC) 150 MG tablet, Take 150 mg by mouth at bedtime., Disp: , Rfl:  .  naproxen (NAPROSYN) 500 MG tablet, TAKE 1 TABLET (500 MG TOTAL) BY MOUTH 2 (TWO) TIMES DAILY WITH A MEAL. (Patient not taking: Reported on 02/22/2019), Disp: 60 tablet, Rfl: 1 .  predniSONE (STERAPRED UNI-PAK 21 TAB) 10 MG (21) TBPK tablet, Taper as directed. (Patient  not taking: Reported on 02/22/2019), Disp: 21 tablet, Rfl: 1 .  zolpidem (AMBIEN) 10 MG tablet, Take 1 tablet (10 mg total) by mouth at bedtime as needed for up to 30 days for sleep., Disp: 30 tablet, Rfl: 3  Review of Systems  Constitutional: Negative.   HENT: Negative.   Respiratory: Negative.   Cardiovascular: Negative.   Gastrointestinal: Negative.   Endocrine: Negative.   Allergic/Immunologic: Negative.   Neurological: Negative.   Psychiatric/Behavioral: Negative.     Social History   Tobacco Use  . Smoking status: Former Smoker    Years: 14.00    Quit date: 05/18/1977    Years since quitting: 41.8  . Smokeless tobacco: Never Used  Substance Use Topics  . Alcohol use: Yes    Alcohol/week: 3.0 standard drinks    Types: 3 Cans of beer per week      Objective:   BP 134/76   Pulse 72   Temp 98.4 F (36.9 C)   Resp 16   Ht 5\' 11"  (1.803 m)   Wt 208 lb (94.3 kg)   SpO2 97%   BMI 29.01 kg/m  Vitals:   03/01/19 1031  BP: 134/76  Pulse: 72  Resp: 16  Temp: 98.4 F (36.9 C)  SpO2: 97%  Weight:  208 lb (94.3 kg)  Height: 5\' 11"  (1.803 m)  Body mass index is 29.01 kg/m.   Physical Exam Vitals signs reviewed.  Constitutional:      Appearance: He is well-developed.  HENT:     Head: Normocephalic and atraumatic.     Right Ear: External ear normal.     Left Ear: External ear normal.     Nose: Nose normal.  Eyes:     Conjunctiva/sclera: Conjunctivae normal.     Pupils: Pupils are equal, round, and reactive to light.  Neck:     Musculoskeletal: Normal range of motion and neck supple.  Cardiovascular:     Rate and Rhythm: Normal rate and regular rhythm.     Heart sounds: Normal heart sounds.  Pulmonary:     Effort: Pulmonary effort is normal.     Breath sounds: Normal breath sounds.  Abdominal:     General: Bowel sounds are normal.     Palpations: Abdomen is soft.  Genitourinary:    Rectum: Normal.  Musculoskeletal: Normal range of motion.      Comments: Old Right AC separation.Trace edema.  Skin:    General: Skin is warm and dry.  Neurological:     General: No focal deficit present.     Mental Status: He is alert and oriented to person, place, and time. Mental status is at baseline.  Psychiatric:        Behavior: Behavior normal.        Thought Content: Thought content normal.        Judgment: Judgment normal.   ECG reveals normal sinus rhythm with a right bundle branch block.   No results found for any visits on 03/01/19.     Assessment & Plan    1. Essential (primary) hypertension Controlled.  Follow-up 2021 - EKG 12-Lead  2. Insomnia, unspecified type  - zolpidem (AMBIEN) 10 MG tablet; Take 1 tablet (10 mg total) by mouth at bedtime as needed for sleep.  Dispense: 30 tablet; Refill: Vance, MD  [facility]

## 2019-03-08 ENCOUNTER — Encounter: Payer: Self-pay | Admitting: *Deleted

## 2019-09-16 NOTE — Progress Notes (Signed)
Established patient visit   Patient: Joe Blankenship   DOB: [birth year]   69 y.o. Male  MRN: [id number] Visit Date: 09/20/2019  Today's healthcare provider: [name], MD   Chief Complaint  Patient presents with  . Hypertension  . Hyperlipidemia  . Insomnia   Subjective    HPI Patient retired at the end of 2020 and plans to move to [place] in the fall.  He is excited about this. Uses no drugs, he does not smoke, he drinks about 2-3 beers per day. His 1 complaint today is chronic insomnia.  He has trouble falling asleep at night. Hypertension, follow-up  BP Readings from Last 3 Encounters:  09/20/19 117/71  03/01/19 134/76  01/06/19 134/70   He was last seen for hypertension 6 months ago.  BP at that visit was 134/76. Management since that visit includes; controlled. He reports excellent compliance with treatment. He is not having side effects.  He is exercising. He is adherent to low salt diet.   Outside blood pressures are 139/80.  He does not smoke.  Use of agents associated with hypertension: none.   ----------------------------------------------------------------------------------------- Lipid/Cholesterol, follow-up  Last Lipid Panel: Lab Results  Component Value Date   CHOL 222 (H) 01/06/2019   LDLCALC 87 01/06/2019   HDL 56 01/06/2019   TRIG 397 (H) 01/06/2019   ALT 57 (H) 01/06/2019   AST 29 01/06/2019   PLT 304 01/06/2019    He was last seen for this 8 months ago.  Management since that visit includes; labs checked showing-stable. He reports excellent compliance with treatment. He is not having side effects.  He is following a Low Sodium diet. Current exercise: walking  Wt Readings from Last 3 Encounters:  09/20/19 206 lb 12.8 oz (93.8 kg)  03/01/19 208 lb (94.3 kg)  01/06/19 210 lb (95.3 kg)   Last metabolic panel Lab Results  Component Value Date   GLUCOSE 103 (H) 01/06/2019   NA 137 01/06/2019   K 4.7 01/06/2019   BUN 17  01/06/2019   CREATININE 0.91 01/06/2019   GFRNONAA 85 01/06/2019   GFRAA 98 01/06/2019   CALCIUM 9.7 01/06/2019   AST 29 01/06/2019   ALT 57 (H) 01/06/2019   The 10-year ASCVD risk score Mikey Bussing DC Jr., et al., 2013) is: 16.7%  -----------------------------------------------------------------------------------------  Insomnia, unspecified type From 03/01/2019-on zolpidem (AMBIEN) 10 mg.       Medications: Outpatient Medications Prior to Visit  Medication Sig  . allopurinol (ZYLOPRIM) 300 MG tablet TAKE 1 TABLET (300 MG TOTAL) BY MOUTH DAILY.  Marland Kitchen aspirin 81 MG tablet Take by mouth daily.  Marland Kitchen escitalopram (LEXAPRO) 5 MG tablet TAKE 1 TABLET BY MOUTH EVERY DAY  . Melatonin 10 MG TABS Take 10 mg by mouth at bedtime as needed.  . Olmesartan-amLODIPine-HCTZ 40-10-25 MG TABS TAKE 1 TABLET BY MOUTH DAILY  . omeprazole (PRILOSEC) 20 MG capsule Take 20 mg by mouth daily.  . pravastatin (PRAVACHOL) 40 MG tablet TAKE 1 TABLET BY MOUTH EVERY DAY  . ranitidine (ZANTAC) 150 MG tablet Take 150 mg by mouth at bedtime.  . Colchicine 0.6 MG CAPS Take 0.6 mg by mouth 2 (two) times daily. (Patient not taking: Reported on 09/20/2019)  . naproxen (NAPROSYN) 500 MG tablet TAKE 1 TABLET (500 MG TOTAL) BY MOUTH 2 (TWO) TIMES DAILY WITH A MEAL. (Patient not taking: Reported on 02/22/2019)  . predniSONE (STERAPRED UNI-PAK 21 TAB) 10 MG (21) TBPK tablet Taper as directed. (Patient not taking: Reported  on 02/22/2019)  . zolpidem (AMBIEN) 10 MG tablet Take 1 tablet (10 mg total) by mouth at bedtime as needed for sleep.   No facility-administered medications prior to visit.    Review of Systems  Constitutional: Negative for appetite change, chills and fever.  HENT: Negative.   Eyes: Negative.   Respiratory: Negative for chest tightness, shortness of breath and wheezing.   Cardiovascular: Negative for chest pain and palpitations.  Gastrointestinal: Negative for abdominal pain, nausea and vomiting.  Endocrine:  Negative.   Allergic/Immunologic: Negative.   Neurological: Negative.   Hematological: Negative.   Psychiatric/Behavioral: Positive for sleep disturbance.       Objective    BP 117/71 (BP Location: Right Arm, Patient Position: Sitting, Cuff Size: Normal)   Pulse 77   Temp (!) 96.9 F (36.1 C) (Temporal)   Ht 5\' 11"  (1.803 m)   Wt 206 lb 12.8 oz (93.8 kg)   SpO2 96%   BMI 28.84 kg/m  BP Readings from Last 3 Encounters:  09/20/19 117/71  03/01/19 134/76  01/06/19 134/70   Wt Readings from Last 3 Encounters:  09/20/19 206 lb 12.8 oz (93.8 kg)  03/01/19 208 lb (94.3 kg)  01/06/19 210 lb (95.3 kg)      Physical Exam Vitals reviewed.  Constitutional:      Appearance: He is well-developed.  HENT:     Head: Normocephalic and atraumatic.     Right Ear: External ear normal.     Left Ear: External ear normal.     Nose: Nose normal.  Eyes:     Conjunctiva/sclera: Conjunctivae normal.     Pupils: Pupils are equal, round, and reactive to light.  Cardiovascular:     Rate and Rhythm: Normal rate and regular rhythm.     Heart sounds: Normal heart sounds.  Pulmonary:     Effort: Pulmonary effort is normal.     Breath sounds: Normal breath sounds.  Abdominal:     General: Bowel sounds are normal.     Palpations: Abdomen is soft.  Musculoskeletal:     Cervical back: Normal range of motion and neck supple.     Comments: Trace edema.  Skin:    General: Skin is warm and dry.  Neurological:     General: No focal deficit present.     Mental Status: He is alert and oriented to person, place, and time. Mental status is at baseline.  Psychiatric:        Mood and Affect: Mood normal.        Behavior: Behavior normal.        Thought Content: Thought content normal.        Judgment: Judgment normal.       No results found for any visits on 09/20/19.  Assessment & Plan     1. Insomnia, unspecified type Try trazodone daily and return to clinic 1 month. - traZODone (DESYREL)  100 MG tablet; Take 1 tablet (100 mg total) by mouth at bedtime.  Dispense: 30 tablet; Refill: 5  2. Essential (primary) hypertension Good control on olmesartan and amlodipine  3. Pure hypercholesterolemia On pravastatin  4.  Adjustment disorder with depressed mood Continue escitalopram for now.  Will discuss on next visit  No follow-ups on file.         Qais Jowers Cranford Mon, MD  [facility] [phone] (phone) [phone] (fax)  [place]

## 2019-09-20 ENCOUNTER — Ambulatory Visit: Payer: Self-pay | Admitting: Family Medicine

## 2019-09-20 ENCOUNTER — Ambulatory Visit (INDEPENDENT_AMBULATORY_CARE_PROVIDER_SITE_OTHER): Payer: Medicare Other | Admitting: Family Medicine

## 2019-09-20 ENCOUNTER — Other Ambulatory Visit: Payer: Self-pay

## 2019-09-20 ENCOUNTER — Encounter: Payer: Self-pay | Admitting: Family Medicine

## 2019-09-20 VITALS — BP 117/71 | HR 77 | Temp 96.9°F | Ht 71.0 in | Wt 206.8 lb

## 2019-09-20 DIAGNOSIS — F4321 Adjustment disorder with depressed mood: Secondary | ICD-10-CM

## 2019-09-20 DIAGNOSIS — E7800 Pure hypercholesterolemia, unspecified: Secondary | ICD-10-CM | POA: Diagnosis not present

## 2019-09-20 DIAGNOSIS — I10 Essential (primary) hypertension: Secondary | ICD-10-CM | POA: Diagnosis not present

## 2019-09-20 DIAGNOSIS — G4700 Insomnia, unspecified: Secondary | ICD-10-CM

## 2019-09-20 MED ORDER — TRAZODONE HCL 100 MG PO TABS: 100.0000 mg | ORAL_TABLET | Freq: Every day | ORAL | 5 refills | Status: DC

## 2019-10-15 ENCOUNTER — Other Ambulatory Visit: Payer: Self-pay | Admitting: Family Medicine

## 2019-10-21 ENCOUNTER — Telehealth: Payer: Self-pay | Admitting: Family Medicine

## 2019-10-26 ENCOUNTER — Other Ambulatory Visit: Payer: Self-pay

## 2019-10-26 ENCOUNTER — Ambulatory Visit (INDEPENDENT_AMBULATORY_CARE_PROVIDER_SITE_OTHER): Payer: Medicare Other | Admitting: Family Medicine

## 2019-10-26 ENCOUNTER — Encounter: Payer: Self-pay | Admitting: Family Medicine

## 2019-10-26 VITALS — BP 133/81 | HR 111 | Temp 97.5°F | Ht 71.0 in | Wt 209.4 lb

## 2019-10-26 DIAGNOSIS — G4700 Insomnia, unspecified: Secondary | ICD-10-CM | POA: Diagnosis not present

## 2019-10-26 DIAGNOSIS — I10 Essential (primary) hypertension: Secondary | ICD-10-CM

## 2019-10-26 DIAGNOSIS — E7800 Pure hypercholesterolemia, unspecified: Secondary | ICD-10-CM | POA: Diagnosis not present

## 2019-10-26 MED ORDER — TRAZODONE HCL 150 MG PO TABS: 150.0000 mg | ORAL_TABLET | Freq: Every day | ORAL | 5 refills | Status: DC

## 2019-10-26 NOTE — Progress Notes (Signed)
Trena Platt Tiwanda Threats,acting as a scribe for [name], MD.,have documented all relevant documentation on the behalf of [name], MD,as directed by  [name], MD while in the presence of [name], MD. Established patient visit   Patient: Joe Blankenship   DOB: 04-Sep-1950   69 y.o. Male  MRN: [id number] Visit Date: 10/26/2019  Today's healthcare provider: [name], MD   Chief Complaint  Patient presents with  . Hypertension   Subjective    HPI  Patient feels well and has no complaints.  He is sleeping better with trazodone will but would like to sleep a little bit more through the night, no side effects or  daytime drowsiness Hypertension, follow-up  BP Readings from Last 3 Encounters:  10/26/19 133/81  09/20/19 117/71  03/01/19 134/76   Wt Readings from Last 3 Encounters:  10/26/19 209 lb 6.4 oz (95 kg)  09/20/19 206 lb 12.8 oz (93.8 kg)  03/01/19 208 lb (94.3 kg)     He was last seen for hypertension 1 months ago.  BP at that visit was 117/71. Management since that visit includes; Good control on olmesartan and amlodipine. He reports excellent compliance with treatment. He is not having side effects.  He is exercising. He is adherent to low salt diet.   Outside blood pressures are 131/72.  He does not smoke.  Use of agents associated with hypertension: NSAIDS.   --------------------------------------------------------------------------------------------------- Lipid/Cholesterol, follow-up  Last Lipid Panel: Lab Results  Component Value Date   CHOL 222 (H) 01/06/2019   LDLCALC 87 01/06/2019   HDL 56 01/06/2019   TRIG 397 (H) 01/06/2019    He was last seen for this 1 months ago.  Management since that visit includes; On pravastatin . He reports excellent compliance with treatment. He is not having side effects.  He is following a Low Sodium diet. Current exercise: walking  Last metabolic panel Lab Results    Component Value Date   GLUCOSE 103 (H) 01/06/2019   NA 137 01/06/2019   K 4.7 01/06/2019   BUN 17 01/06/2019   CREATININE 0.91 01/06/2019   GFRNONAA 85 01/06/2019   GFRAA 98 01/06/2019   CALCIUM 9.7 01/06/2019   AST 29 01/06/2019   ALT 57 (H) 01/06/2019   The 10-year ASCVD risk score Mikey Bussing DC Jr., et al., 2013) is: 20.5%  ---------------------------------------------------------------------------------------------------  Insomnia, unspecified type From 09/20/2019-Try trazodone daily and return to clinic 1 month.  Adjustment disorder with depressed mood From 09/20/2019-Continue escitalopram for now.  Will discuss on next visit.  Social History   Tobacco Use  . Smoking status: Former Smoker    Years: 14.00    Quit date: 05/18/1977    Years since quitting: 42.4  . Smokeless tobacco: Never Used  Substance Use Topics  . Alcohol use: Yes    Alcohol/week: 3.0 standard drinks    Types: 3 Cans of beer per week  . Drug use: No       Medications: Outpatient Medications Prior to Visit  Medication Sig  . allopurinol (ZYLOPRIM) 300 MG tablet TAKE 1 TABLET (300 MG TOTAL) BY MOUTH DAILY.  Marland Kitchen aspirin 81 MG tablet Take by mouth daily.  . Colchicine 0.6 MG CAPS Take 0.6 mg by mouth 2 (two) times daily.  Marland Kitchen escitalopram (LEXAPRO) 5 MG tablet TAKE 1 TABLET BY MOUTH EVERY DAY  . Olmesartan-amLODIPine-HCTZ 40-10-25 MG TABS TAKE 1 TABLET BY MOUTH DAILY  . omeprazole (PRILOSEC) 20 MG capsule Take 20 mg by  mouth daily.  . pravastatin (PRAVACHOL) 40 MG tablet TAKE 1 TABLET BY MOUTH EVERY DAY  . ranitidine (ZANTAC) 150 MG tablet Take 150 mg by mouth at bedtime.  . Melatonin 10 MG TABS Take 10 mg by mouth at bedtime as needed. (Patient not taking: Reported on 10/26/2019)  . naproxen (NAPROSYN) 500 MG tablet TAKE 1 TABLET (500 MG TOTAL) BY MOUTH 2 (TWO) TIMES DAILY WITH A MEAL. (Patient not taking: Reported on 02/22/2019)  . traZODone (DESYREL) 100 MG tablet Take 1 tablet (100 mg total) by mouth at  bedtime. (Patient not taking: Reported on 10/26/2019)  . zolpidem (AMBIEN) 10 MG tablet Take 1 tablet (10 mg total) by mouth at bedtime as needed for sleep.  . [DISCONTINUED] predniSONE (STERAPRED UNI-PAK 21 TAB) 10 MG (21) TBPK tablet Taper as directed. (Patient not taking: Reported on 02/22/2019)   No facility-administered medications prior to visit.    Review of Systems  Constitutional: Negative for appetite change, chills and fever.  Eyes: Negative.   Respiratory: Negative for chest tightness, shortness of breath and wheezing.   Cardiovascular: Negative for chest pain and palpitations.  Gastrointestinal: Negative for abdominal pain, nausea and vomiting.  Endocrine: Negative.   Genitourinary: Negative.   Musculoskeletal: Negative.   Allergic/Immunologic: Negative.   Neurological: Negative.   Hematological: Negative.   Psychiatric/Behavioral: Negative.        Objective    BP 133/81 (BP Location: Left Arm, Patient Position: Sitting, Cuff Size: Large)   Pulse (!) 111   Temp (!) 97.5 F (36.4 C) (Temporal)   Ht 5\' 11"  (1.803 m)   Wt 209 lb 6.4 oz (95 kg)   SpO2 96%   BMI 29.21 kg/m  BP Readings from Last 3 Encounters:  10/26/19 133/81  09/20/19 117/71  03/01/19 134/76   Wt Readings from Last 3 Encounters:  10/26/19 209 lb 6.4 oz (95 kg)  09/20/19 206 lb 12.8 oz (93.8 kg)  03/01/19 208 lb (94.3 kg)      Physical Exam Vitals reviewed.  Constitutional:      Appearance: He is well-developed.  HENT:     Head: Normocephalic and atraumatic.     Right Ear: External ear normal.     Left Ear: External ear normal.     Nose: Nose normal.  Eyes:     Conjunctiva/sclera: Conjunctivae normal.     Pupils: Pupils are equal, round, and reactive to light.  Cardiovascular:     Rate and Rhythm: Normal rate and regular rhythm.     Heart sounds: Normal heart sounds.  Pulmonary:     Effort: Pulmonary effort is normal.     Breath sounds: Normal breath sounds.  Abdominal:      General: Bowel sounds are normal.     Palpations: Abdomen is soft.  Genitourinary:    Rectum: Normal.  Musculoskeletal:        General: Normal range of motion.     Cervical back: Normal range of motion and neck supple.     Comments: Old Right AC separation.Trace edema.  Skin:    General: Skin is warm and dry.  Neurological:     General: No focal deficit present.     Mental Status: He is alert and oriented to person, place, and time.  Psychiatric:        Mood and Affect: Mood normal.        Behavior: Behavior normal.        Thought Content: Thought content normal.  Judgment: Judgment normal.       No results found for any visits on 10/26/19.  Assessment & Plan     1. Essential (primary) hypertension Controlled on olmesartan amlodipine HCTZ  2. Pure hypercholesterolemia On pravastatin  3. Insomnia, unspecified type Increase to 150 from 100 mg daily.  Recheck in 3 months - traZODone (DESYREL) 150 MG tablet; Take 1 tablet (150 mg total) by mouth at bedtime.  Dispense: 30 tablet; Refill: 5   No follow-ups on file.      I, [name], MD, have reviewed all documentation for this visit. The documentation on 10/31/19 for the exam, diagnosis, procedures, and orders are all accurate and complete.    Richard Cranford Mon, MD  [facility] [phone] (phone) [phone] (fax)  [place]

## 2020-01-14 ENCOUNTER — Other Ambulatory Visit: Payer: Self-pay | Admitting: Family Medicine

## 2020-01-14 DIAGNOSIS — M109 Gout, unspecified: Secondary | ICD-10-CM

## 2020-01-17 ENCOUNTER — Encounter: Payer: Self-pay | Admitting: Family Medicine

## 2020-01-17 ENCOUNTER — Other Ambulatory Visit: Payer: Self-pay

## 2020-01-17 ENCOUNTER — Ambulatory Visit (INDEPENDENT_AMBULATORY_CARE_PROVIDER_SITE_OTHER): Payer: Medicare Other | Admitting: Family Medicine

## 2020-01-17 VITALS — BP 108/70 | HR 91 | Temp 98.1°F | Resp 16 | Ht 71.0 in | Wt 208.0 lb

## 2020-01-17 DIAGNOSIS — Z1329 Encounter for screening for other suspected endocrine disorder: Secondary | ICD-10-CM | POA: Diagnosis not present

## 2020-01-17 DIAGNOSIS — M109 Gout, unspecified: Secondary | ICD-10-CM

## 2020-01-17 DIAGNOSIS — E7800 Pure hypercholesterolemia, unspecified: Secondary | ICD-10-CM | POA: Diagnosis not present

## 2020-01-17 DIAGNOSIS — I10 Essential (primary) hypertension: Secondary | ICD-10-CM

## 2020-01-17 DIAGNOSIS — Z1389 Encounter for screening for other disorder: Secondary | ICD-10-CM

## 2020-01-17 NOTE — Progress Notes (Signed)
I,Joe Blankenship,acting as a scribe for Joe Durie, MD.,have documented all relevant documentation on the behalf of Joe Durie, MD,as directed by  Joe Durie, MD while in the presence of Joe Durie, MD.   Established patient visit   Patient: Joe Blankenship   DOB: March 09, 1951   69 y.o. Male  MRN: [id number] Visit Date: 01/17/2020  Today's healthcare provider: [name], MD   Chief Complaint  Patient presents with  . Follow-up  . Hyperlipidemia  . Hypertension   Subjective    HPI  Patient retired as of May 20, 2019.  He is getting ready to move to [place].  He feels well.  He has not had his Covid vaccination.  He will need follow-up colonoscopy at April 17, 2021 would be at the appropriate time. Hypertension, follow-up  BP Readings from Last 3 Encounters:  01/17/20 108/70  10/26/19 133/81  09/20/19 117/71   Wt Readings from Last 3 Encounters:  01/17/20 208 lb (94.3 kg)  10/26/19 209 lb 6.4 oz (95 kg)  09/20/19 206 lb 12.8 oz (93.8 kg)     He was last seen for hypertension 2 months ago.  BP at that visit was 133/81. Management since that visit includes; Controlled on olmesartan amlodipine HCTZ. He reports good compliance with treatment. He is not having side effects. none He is exercising. He is adherent to low salt diet.   Outside blood pressures are .  He does not smoke.  Use of agents associated with hypertension: NSAIDS.   --------------------------------------------------------------------  Lipid/Cholesterol, follow-up  Last Lipid Panel: Lab Results  Component Value Date   CHOL 222 (H) 01/06/2019   LDLCALC 87 01/06/2019   HDL 56 01/06/2019   TRIG 397 (H) 01/06/2019    He was last seen for this 1 years ago.  Management since that visit includes; labs checked showing-stable. He reports good compliance with treatment. He is not having side effects. none He is following a Low Sodium diet. Current exercise:  walking  Last metabolic panel Lab Results  Component Value Date   GLUCOSE 103 (H) 01/06/2019   NA 137 01/06/2019   K 4.7 01/06/2019   BUN 17 01/06/2019   CREATININE 0.91 01/06/2019   GFRNONAA 85 01/06/2019   GFRAA 98 01/06/2019   CALCIUM 9.7 01/06/2019   AST 29 01/06/2019   ALT 57 (H) 01/06/2019   The 10-year ASCVD risk score Joe Blankenship DC Jr., et al., 2013) is: 14.7%  --------------------------------------------------------------------       Medications: Outpatient Medications Prior to Visit  Medication Sig  . allopurinol (ZYLOPRIM) 300 MG tablet TAKE 1 TABLET (300 MG TOTAL) BY MOUTH DAILY.  Marland Kitchen aspirin 81 MG tablet Take by mouth daily.  . Colchicine 0.6 MG CAPS Take 0.6 mg by mouth 2 (two) times daily.  Marland Kitchen escitalopram (LEXAPRO) 5 MG tablet TAKE 1 TABLET BY MOUTH EVERY DAY  . Olmesartan-amLODIPine-HCTZ 40-10-25 MG TABS TAKE 1 TABLET BY MOUTH DAILY  . omeprazole (PRILOSEC) 20 MG capsule Take 20 mg by mouth daily.  . pravastatin (PRAVACHOL) 40 MG tablet TAKE 1 TABLET BY MOUTH EVERY DAY  . ranitidine (ZANTAC) 150 MG tablet Take 150 mg by mouth at bedtime.  . traZODone (DESYREL) 150 MG tablet Take 1 tablet (150 mg total) by mouth at bedtime.  . Melatonin 10 MG TABS Take 10 mg by mouth at bedtime as needed. (Patient not taking: Reported on 10/26/2019)  . naproxen (NAPROSYN) 500 MG tablet TAKE 1 TABLET (500 MG TOTAL) BY  MOUTH 2 (TWO) TIMES DAILY WITH A MEAL. (Patient not taking: Reported on 02/22/2019)  . zolpidem (AMBIEN) 10 MG tablet Take 1 tablet (10 mg total) by mouth at bedtime as needed for sleep.   No facility-administered medications prior to visit.    Review of Systems  All other systems reviewed and are negative.      Objective    BP 108/70 (BP Location: Right Arm, Patient Position: Sitting, Cuff Size: Large)   Pulse 91   Temp 98.1 F (36.7 C) (Oral)   Resp 16   Ht 5\' 11"  (1.803 m)   Wt 208 lb (94.3 kg)   SpO2 97%   BMI 29.01 kg/m  BP Readings from Last 3  Encounters:  01/17/20 108/70  10/26/19 133/81  09/20/19 117/71   Wt Readings from Last 3 Encounters:  01/17/20 208 lb (94.3 kg)  10/26/19 209 lb 6.4 oz (95 kg)  09/20/19 206 lb 12.8 oz (93.8 kg)      Physical Exam Vitals reviewed.  Constitutional:      Appearance: He is well-developed.  HENT:     Head: Normocephalic and atraumatic.     Right Ear: External ear normal.     Left Ear: External ear normal.     Nose: Nose normal.  Eyes:     Conjunctiva/sclera: Conjunctivae normal.     Pupils: Pupils are equal, round, and reactive to light.  Cardiovascular:     Rate and Rhythm: Normal rate and regular rhythm.     Heart sounds: Normal heart sounds.  Pulmonary:     Effort: Pulmonary effort is normal.     Breath sounds: Normal breath sounds.  Abdominal:     General: Bowel sounds are normal.     Palpations: Abdomen is soft.  Musculoskeletal:     Cervical back: Normal range of motion and neck supple.     Comments: Trace edema.  Skin:    General: Skin is warm and dry.  Neurological:     General: No focal deficit present.     Mental Status: He is alert and oriented to person, place, and time. Mental status is at baseline.  Psychiatric:        Mood and Affect: Mood normal.        Behavior: Behavior normal.        Thought Content: Thought content normal.        Judgment: Judgment normal.      General Appearance:     Well developed, well nourished male. Alert, cooperative, in no acute distress, appears stated age  Head:    Normocephalic, without obvious abnormality, atraumatic  Eyes:    PERRL, conjunctiva/corneas clear, EOM's intact, fundi    benign, both eyes       Ears:    Normal TM's and external ear canals, both ears  Nose:   Nares normal, septum midline, mucosa normal, no drainage   or sinus tenderness  Throat:   Lips, mucosa, and tongue normal; teeth and gums normal  Neck:   Supple, symmetrical, trachea midline, no adenopathy;       thyroid:  No  enlargement/tenderness/nodules; no carotid   bruit or JVD  Back:     Symmetric, no curvature, ROM normal, no CVA tenderness  Lungs:     Clear to auscultation bilaterally, respirations unlabored  Chest wall:    No tenderness or deformity  Heart:    Normal heart rate. Normal rhythm. No murmurs, rubs, or gallops.  S1 and S2 normal  Abdomen:  Soft, non-tender, bowel sounds active all four quadrants,    no masses, no organomegaly  Genitalia:    deferred  Rectal:    deferred  Extremities:   All extremities are intact. No cyanosis or edema  Pulses:   2+ and symmetric all extremities  Skin:   Skin color, texture, turgor normal, no rashes or lesions  Lymph nodes:   Cervical, supraclavicular, and axillary nodes normal  Neurologic:   CNII-XII intact. Normal strength, sensation and reflexes      throughout     No results found for any visits on 01/17/20.  Assessment & Plan     1. Essential (primary) hypertension Controlled on olmesartan/amlodipine/HCT.  - CBC w/Diff/Platelet - Comprehensive Metabolic Panel (CMET) - Lipid panel - TSH - POCT urinalysis dipstick  2. Pure hypercholesterolemia On pravastatin 40 - CBC w/Diff/Platelet - Comprehensive Metabolic Panel (CMET) - Lipid panel - TSH  3. Screening for thyroid disorder  - CBC w/Diff/Platelet - Comprehensive Metabolic Panel (CMET) - Lipid panel - TSH  4. Gouty arthritis On allopurinol 300 mg, goal uric acid less than 6 - Uric acid  5. Screening for blood or protein in urine  - POCT urinalysis dipstick 6.  Chronic insomnia Controlled on trazodone 150 mg  No follow-ups on file.         Honora Searson Cranford Mon, MD  [facility] [phone] (phone) [phone] (fax)  [place]

## 2020-02-04 ENCOUNTER — Encounter: Payer: Self-pay | Admitting: Physician Assistant

## 2020-02-04 ENCOUNTER — Inpatient Hospital Stay: Payer: Medicare Other

## 2020-02-04 ENCOUNTER — Other Ambulatory Visit: Payer: Self-pay

## 2020-02-04 ENCOUNTER — Emergency Department: Payer: Medicare Other

## 2020-02-04 ENCOUNTER — Inpatient Hospital Stay
Admission: EM | Admit: 2020-02-04 | Discharge: 2020-02-06 | DRG: 494 | Disposition: A | Discharge status: Home Health | Payer: Medicare Other | Attending: Internal Medicine | Admitting: Internal Medicine

## 2020-02-04 DIAGNOSIS — M109 Gout, unspecified: Secondary | ICD-10-CM | POA: Diagnosis present

## 2020-02-04 DIAGNOSIS — Z83438 Family history of other disorder of lipoprotein metabolism and other lipidemia: Secondary | ICD-10-CM

## 2020-02-04 DIAGNOSIS — S82852A Displaced trimalleolar fracture of left lower leg, initial encounter for closed fracture: Secondary | ICD-10-CM | POA: Diagnosis present

## 2020-02-04 DIAGNOSIS — W19XXXA Unspecified fall, initial encounter: Secondary | ICD-10-CM

## 2020-02-04 DIAGNOSIS — E785 Hyperlipidemia, unspecified: Secondary | ICD-10-CM | POA: Diagnosis present

## 2020-02-04 DIAGNOSIS — G4733 Obstructive sleep apnea (adult) (pediatric): Secondary | ICD-10-CM | POA: Diagnosis present

## 2020-02-04 DIAGNOSIS — W19XXXS Unspecified fall, sequela: Secondary | ICD-10-CM | POA: Diagnosis not present

## 2020-02-04 DIAGNOSIS — Y92019 Unspecified place in single-family (private) house as the place of occurrence of the external cause: Secondary | ICD-10-CM | POA: Diagnosis not present

## 2020-02-04 DIAGNOSIS — Z79899 Other long term (current) drug therapy: Secondary | ICD-10-CM | POA: Diagnosis not present

## 2020-02-04 DIAGNOSIS — Z01818 Encounter for other preprocedural examination: Secondary | ICD-10-CM | POA: Diagnosis not present

## 2020-02-04 DIAGNOSIS — Z20822 Contact with and (suspected) exposure to covid-19: Secondary | ICD-10-CM | POA: Diagnosis present

## 2020-02-04 DIAGNOSIS — Z8249 Family history of ischemic heart disease and other diseases of the circulatory system: Secondary | ICD-10-CM | POA: Diagnosis not present

## 2020-02-04 DIAGNOSIS — K219 Gastro-esophageal reflux disease without esophagitis: Secondary | ICD-10-CM | POA: Diagnosis present

## 2020-02-04 DIAGNOSIS — S82852S Displaced trimalleolar fracture of left lower leg, sequela: Secondary | ICD-10-CM | POA: Diagnosis not present

## 2020-02-04 DIAGNOSIS — T148XXA Other injury of unspecified body region, initial encounter: Secondary | ICD-10-CM

## 2020-02-04 DIAGNOSIS — W010XXA Fall on same level from slipping, tripping and stumbling without subsequent striking against object, initial encounter: Secondary | ICD-10-CM | POA: Diagnosis present

## 2020-02-04 DIAGNOSIS — Z7982 Long term (current) use of aspirin: Secondary | ICD-10-CM | POA: Diagnosis not present

## 2020-02-04 DIAGNOSIS — Z85820 Personal history of malignant melanoma of skin: Secondary | ICD-10-CM

## 2020-02-04 DIAGNOSIS — E7800 Pure hypercholesterolemia, unspecified: Secondary | ICD-10-CM | POA: Diagnosis not present

## 2020-02-04 DIAGNOSIS — Z87891 Personal history of nicotine dependence: Secondary | ICD-10-CM

## 2020-02-04 DIAGNOSIS — M25572 Pain in left ankle and joints of left foot: Secondary | ICD-10-CM | POA: Diagnosis present

## 2020-02-04 DIAGNOSIS — I10 Essential (primary) hypertension: Secondary | ICD-10-CM | POA: Diagnosis present

## 2020-02-04 LAB — BASIC METABOLIC PANEL
Anion gap: 15 (ref 5–15)
BUN: 14 mg/dL (ref 8–23)
CO2: 21 mmol/L — ABNORMAL LOW (ref 22–32)
Calcium: 9.1 mg/dL (ref 8.9–10.3)
Chloride: 101 mmol/L (ref 98–111)
Creatinine, Ser: 0.93 mg/dL (ref 0.61–1.24)
GFR calc Af Amer: >60 mL/min (ref >60)
GFR calc non Af Amer: >60 mL/min (ref >60)
Glucose, Bld: 82 mg/dL (ref 70–99)
Potassium: 3.8 mmol/L (ref 3.5–5.1)
Sodium: 137 mmol/L (ref 135–145)

## 2020-02-04 LAB — CBC WITH DIFFERENTIAL/PLATELET
Abs Immature Granulocytes: 0.06 10*3/uL (ref 0.00–0.07)
Basophils Absolute: 0.1 10*3/uL (ref 0.0–0.1)
Basophils Relative: 1 %
Eosinophils Absolute: 0.2 10*3/uL (ref 0.0–0.5)
Eosinophils Relative: 2 %
HCT: 41.6 % (ref 39.0–52.0)
Hemoglobin: 14.3 g/dL (ref 13.0–17.0)
Immature Granulocytes: 1 %
Lymphocytes Relative: 19 %
Lymphs Abs: 2.1 10*3/uL (ref 0.7–4.0)
MCH: 34 pg (ref 26.0–34.0)
MCHC: 34.4 g/dL (ref 30.0–36.0)
MCV: 99 fL (ref 80.0–100.0)
Monocytes Absolute: 1 10*3/uL (ref 0.1–1.0)
Monocytes Relative: 9 %
Neutro Abs: 7.6 10*3/uL (ref 1.7–7.7)
Neutrophils Relative %: 68 %
Platelets: 255 10*3/uL (ref 150–400)
RBC: 4.2 MIL/uL — ABNORMAL LOW (ref 4.22–5.81)
RDW: 13.4 % (ref 11.5–15.5)
WBC: 11 10*3/uL — ABNORMAL HIGH (ref 4.0–10.5)
nRBC: 0 % (ref 0.0–0.2)

## 2020-02-04 LAB — SARS CORONAVIRUS 2 BY RT PCR (HOSPITAL ORDER, PERFORMED IN ~~LOC~~ HOSPITAL LAB): SARS Coronavirus 2: NEGATIVE

## 2020-02-04 MED ORDER — ONDANSETRON HCL 4 MG/2ML IJ SOLN
4.0000 mg | Freq: Once | INTRAMUSCULAR | Status: AC
  Administered 2020-02-04: 4 mg via INTRAVENOUS
  Filled 2020-02-04: qty 2

## 2020-02-04 MED ORDER — MORPHINE SULFATE (PF) 4 MG/ML IV SOLN
4.0000 mg | INTRAVENOUS | Status: AC | PRN
  Administered 2020-02-04 (×2): 4 mg via INTRAVENOUS
  Filled 2020-02-04 (×2): qty 1

## 2020-02-04 MED ORDER — SODIUM CHLORIDE 0.9 % IV SOLN: INTRAVENOUS | Status: DC

## 2020-02-04 MED ORDER — SENNOSIDES-DOCUSATE SODIUM 8.6-50 MG PO TABS: 1.0000 | ORAL_TABLET | Freq: Every evening | ORAL | Status: DC | PRN

## 2020-02-04 MED ORDER — HYDROCODONE-ACETAMINOPHEN 5-325 MG PO TABS: 1.0000 | ORAL_TABLET | Freq: Four times a day (QID) | ORAL | Status: DC | PRN

## 2020-02-04 MED ORDER — MORPHINE SULFATE (PF) 2 MG/ML IV SOLN
0.5000 mg | INTRAVENOUS | Status: DC | PRN
  Administered 2020-02-05 (×3): 0.5 mg via INTRAVENOUS
  Filled 2020-02-04 (×3): qty 1

## 2020-02-04 MED ORDER — CEFAZOLIN SODIUM-DEXTROSE 2-4 GM/100ML-% IV SOLN
2.0000 g | Freq: Once | INTRAVENOUS | Status: DC
  Filled 2020-02-04: qty 100

## 2020-02-04 MED ORDER — ZOLPIDEM TARTRATE 5 MG PO TABS: 5.0000 mg | ORAL_TABLET | Freq: Every evening | ORAL | Status: DC | PRN

## 2020-02-04 NOTE — Progress Notes (Signed)
PHARMACIST - PHYSICIAN ORDER COMMUNICATION  CONCERNING: P&T Medication Policy for Ambien (Zolpidem)  DESCRIPTION:  This patient's order for:  Ambien (Zolpidem) 10mg  QHS  has been noted.  For male patients and patients age 71 years or older, dosage of zolpidem automatically limited to 5 mg.  ACTION TAKEN: The pharmacy department has adjusted the dose of Zolpidem to 5mg  per policy.   Rowland Lathe ,PharmD Clinical Pharmacist 02/04/2020 10:29 PM

## 2020-02-04 NOTE — H&P (Signed)
History and Physical    Joe Blankenship XNA:[id number] DOB: August 29, 1950 DOA: 02/04/2020  PCP: Jerrol Banana., MD   Patient coming from: Home  I have personally briefly reviewed patient's old medical records in [place]  Chief Complaint: Fall, left ankle pain and swelling  HPI: Joe Blankenship is a 69 y.o. male with medical history significant for HTN, OSA, gouty arthritis, HLD who was in his usual state of health when he tripped on the threshold of the door falling and injuring his left ankle.  He denied preceding lightheadedness, dizziness, visual disturbance, one-sided weakness numbness or tingling or preceding chest pain, palpitations or shortness of breath.  He denied recent illness.  Denies cough, fever or chills.  He noted immediate pain swelling and the feeling of unsteadiness of the left ankle. ED Course: On arrival in the emergency room, vitals were within normal limits.  Initial blood work unremarkable except for slightly elevated WBC of 11,000.  Ankle x-ray with displaced bimalleolar fracture with subluxation of the ankle mortise.  Chest x-ray clear.  EKG pending the emergency room provider spoke with orthopedist Dr. Roland Rack who recommended admission for ORIF in the a.m.  Hospitalist consulted for admission.  Review of Systems: As per HPI otherwise all other systems on review of systems negative.    Past Medical History:  Diagnosis Date  . GERD (gastroesophageal reflux disease)   . Gout   . Hyperlipidemia   . Hypertension   . Melanoma (Joe Blankenship)   . Melanoma (Joe Blankenship)   . Obstructive sleep apnea   . Tubular adenoma     Past Surgical History:  Procedure Laterality Date  . BASAL CELL CARCINOMA EXCISION    . COLONOSCOPY WITH PROPOFOL N/A 04/17/2016   Procedure: COLONOSCOPY WITH PROPOFOL;  Surgeon: Jonathon Bellows, MD;  Location: [facility] ENDOSCOPY;  Service: Endoscopy;  Laterality: N/A;  . MELANOMA EXCISION     x 2     reports that he quit smoking about 42 years ago. He quit after  14.00 years of use. He has never used smokeless tobacco. He reports current alcohol use of about 3.0 standard drinks of alcohol per week. He reports that he does not use drugs.  No Known Allergies  Family History  Problem Relation Age of Onset  . Pancreatitis Mother   . Heart disease Father   . Hyperlipidemia Father   . Hypertension Father   . Heart attack Father       Prior to Admission medications   Medication Sig Start Date End Date Taking? Authorizing Provider  allopurinol (ZYLOPRIM) 300 MG tablet TAKE 1 TABLET (300 MG TOTAL) BY MOUTH DAILY. 01/14/20   Jerrol Banana., MD  aspirin 81 MG tablet Take by mouth daily. 01/11/13   [provider]  Colchicine 0.6 MG CAPS Take 0.6 mg by mouth 2 (two) times daily. 12/14/15   Jerrol Banana., MD  escitalopram (LEXAPRO) 5 MG tablet TAKE 1 TABLET BY MOUTH EVERY DAY 12/14/17   Jerrol Banana., MD  Melatonin 10 MG TABS Take 10 mg by mouth at bedtime as needed. Patient not taking: Reported on 10/26/2019 03/11/17   Jerrol Banana., MD  naproxen (NAPROSYN) 500 MG tablet TAKE 1 TABLET (500 MG TOTAL) BY MOUTH 2 (TWO) TIMES DAILY WITH A MEAL. Patient not taking: Reported on 02/22/2019 02/24/18   Jerrol Banana., MD  Olmesartan-amLODIPine-HCTZ 40-10-25 MG TABS TAKE 1 TABLET BY MOUTH DAILY 02/16/19   Jerrol Banana., MD  omeprazole (PRILOSEC) 20 MG capsule Take 20 mg by mouth daily.    [provider]  pravastatin (PRAVACHOL) 40 MG tablet TAKE 1 TABLET BY MOUTH EVERY DAY 10/15/19   Jerrol Banana., MD  ranitidine (ZANTAC) 150 MG tablet Take 150 mg by mouth at bedtime.    [provider]  traZODone (DESYREL) 150 MG tablet Take 1 tablet (150 mg total) by mouth at bedtime. 10/26/19   Jerrol Banana., MD  zolpidem (AMBIEN) 10 MG tablet Take 1 tablet (10 mg total) by mouth at bedtime as needed for sleep. 03/01/19 03/31/19  Jerrol Banana., MD    Physical Exam: Vitals:   02/04/20  1937 02/04/20 1938 02/04/20 2156  BP: 134/71  (!) 157/77  Pulse: 82  90  Resp: 18    Temp: 98.6 F (37 C)    TempSrc: Oral    SpO2: 96%  92%  Weight:  90.7 kg   Height:  5\' 10"  (1.778 m)      Vitals:   02/04/20 1937 02/04/20 1938 02/04/20 2156  BP: 134/71  (!) 157/77  Pulse: 82  90  Resp: 18    Temp: 98.6 F (37 C)    TempSrc: Oral    SpO2: 96%  92%  Weight:  90.7 kg   Height:  5\' 10"  (1.778 m)       Constitutional: Alert and oriented x 3 .  In mild pain discomfort HEENT:      Head: Normocephalic and atraumatic.         Eyes: PERLA, EOMI, Conjunctivae are normal. Sclera is non-icteric.       Mouth/Throat: Mucous membranes are moist.       Neck: Supple with no signs of meningismus. Cardiovascular: Regular rate and rhythm. No murmurs, gallops, or rubs. 2+ symmetrical distal pulses are present .  Respiratory: Respiratory effort normal .Lungs sounds clear bilaterally. No wheezes, crackles, or rhonchi.  Gastrointestinal: Soft, non tender, and non distended with positive bowel sounds. No rebound or guarding. Genitourinary: No CVA tenderness. Musculoskeletal:  Left lower leg in splint.  No cyanosis, or erythema of extremities. Neurologic:  Face is symmetric. Moving all extremities. No gross focal neurologic deficits . Skin: Skin is warm, dry.  No rash or ulcers Psychiatric: Mood and affect are normal    Labs on Admission: I have personally reviewed following labs and imaging studies  CBC: Recent Labs  Lab 02/04/20 2026  WBC 11.0*  NEUTROABS 7.6  HGB 14.3  HCT 41.6  MCV 99.0  PLT 616   Basic Metabolic Panel: Recent Labs  Lab 02/04/20 2026  NA 137  K 3.8  CL 101  CO2 21*  GLUCOSE 82  BUN 14  CREATININE 0.93  CALCIUM 9.1   GFR: Estimated Creatinine Clearance: 82.5 mL/min (by C-G formula based on SCr of 0.93 mg/dL). Liver Function Tests: No results for input(s): AST, ALT, ALKPHOS, BILITOT, PROT, ALBUMIN in the last 168 hours. No results for input(s):  LIPASE, AMYLASE in the last 168 hours. No results for input(s): AMMONIA in the last 168 hours. Coagulation Profile: No results for input(s): INR, PROTIME in the last 168 hours. Cardiac Enzymes: No results for input(s): CKTOTAL, CKMB, CKMBINDEX, TROPONINI in the last 168 hours. BNP (last 3 results) No results for input(s): PROBNP in the last 8760 hours. HbA1C: No results for input(s): HGBA1C in the last 72 hours. CBG: No results for input(s): GLUCAP in the last 168 hours. Lipid Profile: No results for input(s): CHOL,  HDL, LDLCALC, TRIG, CHOLHDL, LDLDIRECT in the last 72 hours. Thyroid Function Tests: No results for input(s): TSH, T4TOTAL, FREET4, T3FREE, THYROIDAB in the last 72 hours. Anemia Panel: No results for input(s): VITAMINB12, FOLATE, FERRITIN, TIBC, IRON, RETICCTPCT in the last 72 hours. Urine analysis:    Component Value Date/Time   BILIRUBINUR Negative 03/05/2016 1017   PROTEINUR Negative 03/05/2016 1017   UROBILINOGEN 0.2 03/05/2016 1017   NITRITE Negative 03/05/2016 1017   LEUKOCYTESUR Negative 03/05/2016 1017    Radiological Exams on Admission: DG Ankle 2 Views Left  Result Date: 02/04/2020 CLINICAL DATA:  Post reduction views. EXAM: LEFT ANKLE - 2 VIEW COMPARISON:  February 04, 2020 (8:11 p.m.) FINDINGS: The left ankle was imaged in a fiberglass cast with subsequently obscured osseous and soft tissue detail. Acute fractures of the medial, posterior and lateral malleoli are again noted. There is no evidence of dislocation. Mild to moderate severity lateral soft tissue swelling is seen. IMPRESSION: Status post reduction of trimalleolar fractures of the left ankle. Electronically Signed   By: Virgina Norfolk M.D.   On: 02/04/2020 22:26   DG Ankle Complete Left  Result Date: 02/04/2020 CLINICAL DATA:  Left ankle pain after twisting injury down stairs EXAM: LEFT ANKLE COMPLETE - 3+ VIEW COMPARISON:  None. FINDINGS: Mild diffuse left ankle soft tissue swelling. Oblique  lateral malleolus fracture with 6 mm posterolateral displacement of the distal fracture fragment. Transverse medial malleolus fracture with 6 mm lateral displacement of the distal fracture fragment. Suspected nondisplaced posterior malleolar fracture in the posterior distal left tibia. Lateral 6 mm subluxation of the talus relative at the ankle mortise. No focal osseous lesions. No radiopaque foreign bodies. Small Achilles and plantar left calcaneal spurs. Vascular calcifications in the soft tissues. IMPRESSION: Displaced medial and lateral malleolus fractures with suspected nondisplaced posterior malleolus fracture. Lateral talar 6 mm subluxation at the ankle mortise. Electronically Signed   By: Ilona Sorrel M.D.   On: 02/04/2020 20:25     Assessment/Plan 69 year old male with history of HTN, OSA, gouty arthritis, HLD presenting with mechanical fall sustaining left trimalleolar fracture  Left trimalleolar fracture   Accidental fall   Preoperative clearance -Patient with no significant comorbidities to prevent operative repair in the a.m. at low risk for perioperative cardiopulmonary events and can proceed -N.p.o. after midnight for surgical procedure -Pain management -Further orders per orthopedist    Essential (primary) hypertension -Hydralazine as needed BP over 160/90 while n.p.o.    Obstructive apnea  -CPAP if desired    HLD (hyperlipidemia) -Resume statin when oral intake reestablished    Gouty arthritis -Resume home allopurinol when oral intake reestablished    DVT prophylaxis: SCDs for surgery in the a.m.  code Status: full code  Family Communication:  none  Disposition Plan: Back to previous home environment Consults called: Orthopedics Status: Inpatient for inpatient only procedure    Athena Masse MD [facility]     02/04/2020, 10:32 PM

## 2020-02-04 NOTE — ED Notes (Signed)
Pt states he was walking and tripped, hurting his left ankle. Pt states"it is just flopping."

## 2020-02-04 NOTE — ED Triage Notes (Signed)
Patient reports tripped and fell going in to his house.  Reports left ankle pain.

## 2020-02-04 NOTE — ED Provider Notes (Signed)
[facility] Emergency Department Provider Note ____________________________________________  Time seen: 2008  I have reviewed the triage vital signs and the nursing notes.  HISTORY  Chief Complaint  Ankle Pain  HPI Joe Blankenship is a 69 y.o. male resents to the ED for evaluation of a mechanical fall resulting in left ankle pain, disability, and deformity.  Patient describes he tripped as he walked into his home, after he got his foot caught on the threshold.  He landed awkwardly on his left ankle, and experienced immediate pain and disability.  He denies any other injury at this time.  Patient also denies any preceding syncope, weakness, or dizziness.   He denies any head injury, chest pain, shortness of breath, or weakness.  Patient has a history consistent with hypertension, hyperlipidemia, gout, GERD, and melanoma.  He also has obstructive sleep apnea.  Past Medical History:  Diagnosis Date  . GERD (gastroesophageal reflux disease)   . Gout   . Hyperlipidemia   . Hypertension   . Melanoma ([place])   . Melanoma ([place])   . Obstructive sleep apnea   . Tubular adenoma     Patient Active Problem List   Diagnosis Date Noted  . Accidental fall 02/04/2020  . Preoperative clearance 02/04/2020  . Left trimalleolar fracture 02/04/2020  . Hyperglycemia 03/11/2017  . Gouty arthritis 03/05/2016  . Insomnia 03/05/2016  . Basal cell carcinoma 11/11/2014  . Essential (primary) hypertension 11/11/2014  . Malignant melanoma ([place]) 11/11/2014  . Obstructive apnea 11/11/2014  . HLD (hyperlipidemia) 11/11/2014    Past Surgical History:  Procedure Laterality Date  . BASAL CELL CARCINOMA EXCISION    . COLONOSCOPY WITH PROPOFOL N/A 04/17/2016   Procedure: COLONOSCOPY WITH PROPOFOL;  Surgeon: Jonathon Bellows, MD;  Location: [facility] ENDOSCOPY;  Service: Endoscopy;  Laterality: N/A;  . MELANOMA EXCISION     x 2    Prior to Admission medications   Medication Sig Start Date End Date  Taking? Authorizing Provider  allopurinol (ZYLOPRIM) 300 MG tablet TAKE 1 TABLET (300 MG TOTAL) BY MOUTH DAILY. 01/14/20   Jerrol Banana., MD  aspirin 81 MG tablet Take by mouth daily. 01/11/13   [provider]  Colchicine 0.6 MG CAPS Take 0.6 mg by mouth 2 (two) times daily. 12/14/15   Jerrol Banana., MD  escitalopram (LEXAPRO) 5 MG tablet TAKE 1 TABLET BY MOUTH EVERY DAY 12/14/17   Jerrol Banana., MD  Melatonin 10 MG TABS Take 10 mg by mouth at bedtime as needed. Patient not taking: Reported on 10/26/2019 03/11/17   Jerrol Banana., MD  naproxen (NAPROSYN) 500 MG tablet TAKE 1 TABLET (500 MG TOTAL) BY MOUTH 2 (TWO) TIMES DAILY WITH A MEAL. Patient not taking: Reported on 02/22/2019 02/24/18   Jerrol Banana., MD  Olmesartan-amLODIPine-HCTZ 40-10-25 MG TABS TAKE 1 TABLET BY MOUTH DAILY 02/16/19   Jerrol Banana., MD  omeprazole (PRILOSEC) 20 MG capsule Take 20 mg by mouth daily.    [provider]  pravastatin (PRAVACHOL) 40 MG tablet TAKE 1 TABLET BY MOUTH EVERY DAY 10/15/19   Jerrol Banana., MD  ranitidine (ZANTAC) 150 MG tablet Take 150 mg by mouth at bedtime.    [provider]  traZODone (DESYREL) 150 MG tablet Take 1 tablet (150 mg total) by mouth at bedtime. 10/26/19   Jerrol Banana., MD  zolpidem (AMBIEN) 10 MG tablet Take 1 tablet (10 mg total) by mouth at bedtime as  needed for sleep. 03/01/19 03/31/19  Jerrol Banana., MD    Allergies Patient has no known allergies.  Family History  Problem Relation Age of Onset  . Pancreatitis Mother   . Heart disease Father   . Hyperlipidemia Father   . Hypertension Father   . Heart attack Father     Social History Social History   Tobacco Use  . Smoking status: Former Smoker    Years: 14.00    Quit date: 05/18/1977    Years since quitting: 42.7  . Smokeless tobacco: Never Used  Substance Use Topics  . Alcohol use: Yes    Alcohol/week: 3.0 standard  drinks    Types: 3 Cans of beer per week  . Drug use: No    Review of Systems  Constitutional: Negative for fever. Cardiovascular: Negative for chest pain. Respiratory: Negative for shortness of breath. Gastrointestinal: Negative for abdominal pain, vomiting and diarrhea. Genitourinary: Negative for dysuria. Musculoskeletal: Negative for back pain. Left ankle deformity & disability Skin: Negative for rash. Neurological: Negative for headaches, focal weakness or numbness. ____________________________________________  PHYSICAL EXAM:  VITAL SIGNS: ED Triage Vitals  Enc Vitals Group     BP 02/04/20 1937 134/71     Pulse Rate 02/04/20 1937 82     Resp 02/04/20 1937 18     Temp 02/04/20 1937 98.6 F (37 C)     Temp Source 02/04/20 1937 Oral     SpO2 02/04/20 1937 96 %     Weight 02/04/20 1938 200 lb (90.7 kg)     Height 02/04/20 1938 5\' 10"  (1.778 m)     Head Circumference --      Peak Flow --      Pain Score 02/04/20 1938 5     Pain Loc --      Pain Edu? --      Excl. in [place]? --     Constitutional: Alert and oriented. Well appearing and in no distress. Head: Normocephalic and atraumatic. Eyes: Conjunctivae are normal. Normal extraocular movements Cardiovascular: Normal rate, regular rhythm. Normal distal pulses. Respiratory: Normal respiratory effort. No wheezes/rales/rhonchi. Gastrointestinal: Soft and nontender. No distention. Musculoskeletal: Patient with obvious deformity to the left ankle.  Patient with instability to the ankle joint.  No calf tenderness is elicited.  Knee exam is benign and normal at this time.  Nontender with normal range of motion in all extremities.  Neurologic:  Normal gait without ataxia. Normal speech and language. No gross focal neurologic deficits are appreciated. Skin:  Skin is warm, dry and intact. No rash noted.  Laceration to left great toe noted. Psychiatric: Mood and affect are normal. Patient exhibits appropriate insight and  judgment. ____________________________________________   LABS (pertinent positives/negatives) Labs Reviewed  BASIC METABOLIC PANEL - Abnormal; Notable for the following components:      Result Value   CO2 21 (*)    All other components within normal limits  CBC WITH DIFFERENTIAL/PLATELET - Abnormal; Notable for the following components:   WBC 11.0 (*)    RBC 4.20 (*)    All other components within normal limits  SARS CORONAVIRUS 2 BY RT PCR (HOSPITAL ORDER, [facility])  ____________________________________________  EKG  ____________________________________________   RADIOLOGY  DG Left Ankle     IMPRESSION: Displaced medial and lateral malleolus fractures with suspected nondisplaced posterior malleolus fracture. Lateral talar 6 mm subluxation at the ankle mortise  Left Ankle Post Reduction   IMPRESSION: Status post reduction of trimalleolar fractures  of the left ankle. ____________________________________________  PROCEDURES  Morphine 4 mg IVP x 2 prn Zofran 4 mg IVP  .Ortho Injury Treatment  Date/Time: 02/04/2020 9:09 PM Performed by: Melvenia Needles, PA-C Authorized by: Melvenia Needles, PA-C   Consent:    Consent obtained:  Verbal   Consent given by:  Patient   Alternatives discussed:  ImmobilizationInjury location: ankle Location details: left ankle Injury type: fracture-dislocation Fracture type: trimalleolar Pre-procedure neurovascular assessment: neurovascularly intact Pre-procedure distal perfusion: normal Pre-procedure neurological function: normal Pre-procedure range of motion: reduced  Anesthesia: Local anesthesia used: no  Patient sedated: NoManipulation performed: yes Skin traction used: no Skeletal traction used: yes Reduction successful: yes Immobilization: splint Splint type: double sugar tong Supplies used: cotton padding,  elastic bandage and Ortho-Glass Post-procedure neurovascular  assessment: post-procedure neurovascularly intact Patient tolerance: patient tolerated the procedure well with no immediate complications   ____________________________________________  INITIAL IMPRESSION / ASSESSMENT AND PLAN / ED COURSE  ----------------------------------------- 8:28 PM on 02/04/2020 ----------------------------------------- S/W Dr. Roland Rack: he will consult after hospitalist admission for ORIF in the morning.   ----------------------------------------- 10:15 PM on 02/04/2020 ----------------------------------------- S/w Dr. Prudy Feeler: she will admit to hospitalist service for Ortho consult and surgical intervention.  Patient with ED evaluation initial fracture management of a closed trimalleolar fracture of the left ankle following a mechanical fall.  Patient will be reduced and splinted in the ED, and admitted to the hospital service for surgical intervention in the morning.  Patient is agreeable to the plan at this time.  He denies any other injury at this time.  Joe Blankenship was evaluated in Emergency Department on 02/04/2020 for the symptoms described in the history of present illness. He was evaluated in the context of the global COVID-19 pandemic, which necessitated consideration that the patient might be at risk for infection with the SARS-CoV-2 virus that causes COVID-19. Institutional protocols and algorithms that pertain to the evaluation of patients at risk for COVID-19 are in a state of rapid change based on information released by regulatory bodies including the CDC and federal and state organizations. These policies and algorithms were followed during the patient's care in the ED. ____________________________________________  FINAL CLINICAL IMPRESSION(S) / ED DIAGNOSES  Final diagnoses:  Trimalleolar fracture of ankle, closed, left, initial encounter      Melvenia Needles, PA-C 02/04/20 2249    Harvest Dark, MD 02/04/20 2317

## 2020-02-05 ENCOUNTER — Encounter
Admission: EM | Disposition: A | Discharge status: Home Health | Payer: Self-pay | Source: Home / Self Care | Attending: Internal Medicine

## 2020-02-05 ENCOUNTER — Encounter: Payer: Self-pay | Admitting: Internal Medicine

## 2020-02-05 ENCOUNTER — Inpatient Hospital Stay: Payer: Medicare Other

## 2020-02-05 ENCOUNTER — Inpatient Hospital Stay: Payer: Medicare Other | Admitting: Anesthesiology

## 2020-02-05 DIAGNOSIS — W19XXXS Unspecified fall, sequela: Secondary | ICD-10-CM

## 2020-02-05 DIAGNOSIS — I10 Essential (primary) hypertension: Secondary | ICD-10-CM

## 2020-02-05 DIAGNOSIS — E7800 Pure hypercholesterolemia, unspecified: Secondary | ICD-10-CM

## 2020-02-05 DIAGNOSIS — G4733 Obstructive sleep apnea (adult) (pediatric): Secondary | ICD-10-CM

## 2020-02-05 DIAGNOSIS — S82852A Displaced trimalleolar fracture of left lower leg, initial encounter for closed fracture: Principal | ICD-10-CM

## 2020-02-05 DIAGNOSIS — Z01818 Encounter for other preprocedural examination: Secondary | ICD-10-CM

## 2020-02-05 DIAGNOSIS — M109 Gout, unspecified: Secondary | ICD-10-CM

## 2020-02-05 HISTORY — PX: ORIF ANKLE FRACTURE: SHX5408

## 2020-02-05 LAB — SAMPLE TO BLOOD BANK

## 2020-02-05 SURGERY — OPEN REDUCTION INTERNAL FIXATION (ORIF) ANKLE FRACTURE
Anesthesia: General | Site: Ankle | Laterality: Left

## 2020-02-05 MED ORDER — FENTANYL CITRATE (PF) 100 MCG/2ML IJ SOLN: 25.0000 ug | INTRAMUSCULAR | Status: DC | PRN

## 2020-02-05 MED ORDER — AMLODIPINE BESYLATE 10 MG PO TABS
10.0000 mg | ORAL_TABLET | Freq: Every day | ORAL | Status: DC
  Administered 2020-02-06: 10 mg via ORAL
  Filled 2020-02-05: qty 1

## 2020-02-05 MED ORDER — HYDROMORPHONE HCL 1 MG/ML IJ SOLN
INTRAMUSCULAR | Status: DC | PRN
  Administered 2020-02-05 (×2): .5 mg via INTRAVENOUS

## 2020-02-05 MED ORDER — FENTANYL CITRATE (PF) 100 MCG/2ML IJ SOLN
INTRAMUSCULAR | Status: DC | PRN
Start: 2020-02-05 — End: 2020-02-05
  Administered 2020-02-05: 50 ug via INTRAVENOUS
  Administered 2020-02-05: 25 ug via INTRAVENOUS
  Administered 2020-02-05 (×2): 50 ug via INTRAVENOUS
  Administered 2020-02-05: 25 ug via INTRAVENOUS

## 2020-02-05 MED ORDER — OXYCODONE HCL 5 MG/5ML PO SOLN: 5.0000 mg | Freq: Once | ORAL | Status: DC | PRN

## 2020-02-05 MED ORDER — LIDOCAINE HCL (CARDIAC) PF 100 MG/5ML IV SOSY
PREFILLED_SYRINGE | INTRAVENOUS | Status: DC | PRN
  Administered 2020-02-05: 100 mg via INTRAVENOUS

## 2020-02-05 MED ORDER — ONDANSETRON HCL 4 MG/2ML IJ SOLN
INTRAMUSCULAR | Status: DC | PRN
  Administered 2020-02-05: 4 mg via INTRAVENOUS

## 2020-02-05 MED ORDER — BUPIVACAINE HCL 0.5 % IJ SOLN
INTRAMUSCULAR | Status: DC | PRN
  Administered 2020-02-05: 30 mL

## 2020-02-05 MED ORDER — FENTANYL CITRATE (PF) 100 MCG/2ML IJ SOLN
INTRAMUSCULAR | Status: AC
  Filled 2020-02-05: qty 2

## 2020-02-05 MED ORDER — DOCUSATE SODIUM 100 MG PO CAPS
100.0000 mg | ORAL_CAPSULE | Freq: Two times a day (BID) | ORAL | Status: DC
  Administered 2020-02-05 – 2020-02-06 (×2): 100 mg via ORAL
  Filled 2020-02-05 (×2): qty 1

## 2020-02-05 MED ORDER — OXYCODONE HCL 5 MG PO TABS: 5.0000 mg | ORAL_TABLET | Freq: Once | ORAL | Status: DC | PRN

## 2020-02-05 MED ORDER — DEXAMETHASONE SODIUM PHOSPHATE 10 MG/ML IJ SOLN
INTRAMUSCULAR | Status: DC | PRN
  Administered 2020-02-05: 5 mg via INTRAVENOUS

## 2020-02-05 MED ORDER — METOCLOPRAMIDE HCL 5 MG/ML IJ SOLN: 5.0000 mg | Freq: Three times a day (TID) | INTRAMUSCULAR | Status: DC | PRN

## 2020-02-05 MED ORDER — FLEET ENEMA 7-19 GM/118ML RE ENEM: 1.0000 | ENEMA | Freq: Once | RECTAL | Status: DC | PRN

## 2020-02-05 MED ORDER — CEFAZOLIN SODIUM-DEXTROSE 2-3 GM-%(50ML) IV SOLR
INTRAVENOUS | Status: DC | PRN
  Administered 2020-02-05: 2 g via INTRAVENOUS

## 2020-02-05 MED ORDER — DEXAMETHASONE SODIUM PHOSPHATE 10 MG/ML IJ SOLN
INTRAMUSCULAR | Status: AC
  Filled 2020-02-05: qty 1

## 2020-02-05 MED ORDER — MAGNESIUM HYDROXIDE 400 MG/5ML PO SUSP: 30.0000 mL | Freq: Every day | ORAL | Status: DC | PRN

## 2020-02-05 MED ORDER — ACETAMINOPHEN 325 MG PO TABS: 325.0000 mg | ORAL_TABLET | Freq: Four times a day (QID) | ORAL | Status: DC | PRN

## 2020-02-05 MED ORDER — ACETAMINOPHEN 10 MG/ML IV SOLN
INTRAVENOUS | Status: DC | PRN
  Administered 2020-02-05: 1000 mg via INTRAVENOUS

## 2020-02-05 MED ORDER — ENOXAPARIN SODIUM 40 MG/0.4ML ~~LOC~~ SOLN
40.0000 mg | SUBCUTANEOUS | Status: DC
  Administered 2020-02-06: 40 mg via SUBCUTANEOUS
  Filled 2020-02-05: qty 0.4

## 2020-02-05 MED ORDER — ASPIRIN 81 MG PO CHEW
81.0000 mg | CHEWABLE_TABLET | Freq: Every day | ORAL | Status: DC
  Administered 2020-02-05 – 2020-02-06 (×2): 81 mg via ORAL
  Filled 2020-02-05: qty 1

## 2020-02-05 MED ORDER — ACETAMINOPHEN 500 MG PO TABS
500.0000 mg | ORAL_TABLET | Freq: Four times a day (QID) | ORAL | Status: DC
  Administered 2020-02-05 – 2020-02-06 (×2): 500 mg via ORAL
  Filled 2020-02-05 (×2): qty 1

## 2020-02-05 MED ORDER — PROPOFOL 10 MG/ML IV BOLUS
INTRAVENOUS | Status: DC | PRN
  Administered 2020-02-05: 50 mg via INTRAVENOUS
  Administered 2020-02-05: 150 mg via INTRAVENOUS

## 2020-02-05 MED ORDER — PROPOFOL 10 MG/ML IV BOLUS
INTRAVENOUS | Status: AC
  Filled 2020-02-05: qty 20

## 2020-02-05 MED ORDER — MORPHINE SULFATE (PF) 2 MG/ML IV SOLN
2.0000 mg | INTRAVENOUS | Status: DC | PRN
  Administered 2020-02-05: 2 mg via INTRAVENOUS
  Filled 2020-02-05: qty 1

## 2020-02-05 MED ORDER — LACTATED RINGERS IV SOLN: INTRAVENOUS | Status: DC | PRN

## 2020-02-05 MED ORDER — KETOROLAC TROMETHAMINE 15 MG/ML IJ SOLN
7.5000 mg | Freq: Four times a day (QID) | INTRAMUSCULAR | Status: DC
  Administered 2020-02-05 – 2020-02-06 (×2): 7.5 mg via INTRAVENOUS
  Filled 2020-02-05 (×2): qty 1

## 2020-02-05 MED ORDER — LIDOCAINE HCL (PF) 2 % IJ SOLN
INTRAMUSCULAR | Status: AC
  Filled 2020-02-05: qty 5

## 2020-02-05 MED ORDER — OLMESARTAN-AMLODIPINE-HCTZ 40-10-25 MG PO TABS: 1.0000 | ORAL_TABLET | Freq: Every day | ORAL | Status: DC

## 2020-02-05 MED ORDER — ACETAMINOPHEN 10 MG/ML IV SOLN
INTRAVENOUS | Status: AC
  Filled 2020-02-05: qty 100

## 2020-02-05 MED ORDER — ONDANSETRON HCL 4 MG/2ML IJ SOLN
INTRAMUSCULAR | Status: AC
  Filled 2020-02-05: qty 2

## 2020-02-05 MED ORDER — PHENYLEPHRINE HCL (PRESSORS) 10 MG/ML IV SOLN
INTRAVENOUS | Status: DC | PRN
  Administered 2020-02-05 (×4): 100 ug via INTRAVENOUS

## 2020-02-05 MED ORDER — HYDROCODONE-ACETAMINOPHEN 5-325 MG PO TABS: 1.0000 | ORAL_TABLET | ORAL | Status: DC | PRN

## 2020-02-05 MED ORDER — ALLOPURINOL 300 MG PO TABS
300.0000 mg | ORAL_TABLET | Freq: Every day | ORAL | Status: DC
  Administered 2020-02-05: 300 mg via ORAL
  Filled 2020-02-05 (×2): qty 1

## 2020-02-05 MED ORDER — KETOROLAC TROMETHAMINE 15 MG/ML IJ SOLN: 15.0000 mg | Freq: Once | INTRAMUSCULAR | Status: DC

## 2020-02-05 MED ORDER — PRAVASTATIN SODIUM 20 MG PO TABS
40.0000 mg | ORAL_TABLET | Freq: Every day | ORAL | Status: DC
  Administered 2020-02-05: 40 mg via ORAL
  Filled 2020-02-05: qty 2

## 2020-02-05 MED ORDER — PANTOPRAZOLE SODIUM 40 MG PO TBEC
40.0000 mg | DELAYED_RELEASE_TABLET | Freq: Every day | ORAL | Status: DC
  Administered 2020-02-05 – 2020-02-06 (×2): 40 mg via ORAL
  Filled 2020-02-05: qty 1

## 2020-02-05 MED ORDER — METOCLOPRAMIDE HCL 10 MG PO TABS: 5.0000 mg | ORAL_TABLET | Freq: Three times a day (TID) | ORAL | Status: DC | PRN

## 2020-02-05 MED ORDER — ONDANSETRON HCL 4 MG/2ML IJ SOLN: 4.0000 mg | Freq: Four times a day (QID) | INTRAMUSCULAR | Status: DC | PRN

## 2020-02-05 MED ORDER — CEFAZOLIN SODIUM 1 G IJ SOLR
INTRAMUSCULAR | Status: AC
  Filled 2020-02-05: qty 20

## 2020-02-05 MED ORDER — IRBESARTAN 150 MG PO TABS
300.0000 mg | ORAL_TABLET | Freq: Every day | ORAL | Status: DC
  Administered 2020-02-06: 300 mg via ORAL
  Filled 2020-02-05: qty 2

## 2020-02-05 MED ORDER — DIPHENHYDRAMINE HCL 12.5 MG/5ML PO ELIX: 12.5000 mg | ORAL_SOLUTION | ORAL | Status: DC | PRN

## 2020-02-05 MED ORDER — ONDANSETRON HCL 4 MG PO TABS: 4.0000 mg | ORAL_TABLET | Freq: Four times a day (QID) | ORAL | Status: DC | PRN

## 2020-02-05 MED ORDER — HYDROMORPHONE HCL 1 MG/ML IJ SOLN
INTRAMUSCULAR | Status: AC
  Filled 2020-02-05: qty 1

## 2020-02-05 MED ORDER — CEFAZOLIN SODIUM-DEXTROSE 2-4 GM/100ML-% IV SOLN
2.0000 g | Freq: Four times a day (QID) | INTRAVENOUS | Status: AC
  Administered 2020-02-05 – 2020-02-06 (×3): 2 g via INTRAVENOUS
  Filled 2020-02-05 (×4): qty 100

## 2020-02-05 MED ORDER — HYDROCHLOROTHIAZIDE 25 MG PO TABS
25.0000 mg | ORAL_TABLET | Freq: Every day | ORAL | Status: DC
  Administered 2020-02-06: 25 mg via ORAL
  Filled 2020-02-05: qty 1

## 2020-02-05 MED ORDER — BISACODYL 10 MG RE SUPP: 10.0000 mg | Freq: Every day | RECTAL | Status: DC | PRN

## 2020-02-05 MED ORDER — TRAMADOL HCL 50 MG PO TABS: 50.0000 mg | ORAL_TABLET | Freq: Four times a day (QID) | ORAL | Status: DC | PRN

## 2020-02-05 SURGICAL SUPPLY — 66 items
APL PRP STRL LF DISP 70% ISPRP (MISCELLANEOUS) ×2
BIT DRILL 2.5X2.75 QC CALB (BIT) ×6 IMPLANT
BIT DRILL 2.9 CANN QC NONSTRL (BIT) ×3 IMPLANT
BIT DRILL 3.5X5.5 QC CALB (BIT) ×3 IMPLANT
BIT DRILL CALIBRATED 2.7 (BIT) ×2 IMPLANT
BIT DRILL CALIBRATED 2.7MM (BIT) ×1
BLADE SURG SZ10 CARB STEEL (BLADE) ×6 IMPLANT
BNDG COHESIVE 4X5 TAN STRL (GAUZE/BANDAGES/DRESSINGS) ×3 IMPLANT
BNDG ELASTIC 4X5.8 VLCR STR LF (GAUZE/BANDAGES/DRESSINGS) ×6 IMPLANT
BNDG ELASTIC 6X5.8 VLCR STR LF (GAUZE/BANDAGES/DRESSINGS) ×3 IMPLANT
BNDG ESMARK 6X12 TAN STRL LF (GAUZE/BANDAGES/DRESSINGS) ×3 IMPLANT
BNDG PLASTER FAST 4X5 WHT LF (CAST SUPPLIES) ×12 IMPLANT
BNDG PLSTR 5X4 FST ST WHT LF (CAST SUPPLIES) ×4
CANISTER SUCT 1200ML W/VALVE (MISCELLANEOUS) ×3 IMPLANT
CHLORAPREP W/TINT 26 (MISCELLANEOUS) ×6 IMPLANT
COVER WAND RF STERILE (DRAPES) ×3 IMPLANT
CUFF TOURN SGL QUICK 24 (TOURNIQUET CUFF)
CUFF TOURN SGL QUICK 30 (TOURNIQUET CUFF)
CUFF TRNQT CYL 24X4X16.5-23 (TOURNIQUET CUFF) IMPLANT
CUFF TRNQT CYL 30X4X21-28X (TOURNIQUET CUFF) IMPLANT
DRAPE C-ARM XRAY 36X54 (DRAPES) ×3 IMPLANT
DRAPE C-ARMOR (DRAPES) ×3 IMPLANT
DRAPE INCISE IOBAN 66X45 STRL (DRAPES) ×3 IMPLANT
DRAPE SPLIT 6X30 W/TAPE (DRAPES) ×3 IMPLANT
DRAPE U-SHAPE 47X51 STRL (DRAPES) ×3 IMPLANT
ELECT CAUTERY BLADE 6.4 (BLADE) ×3 IMPLANT
ELECT REM PT RETURN 9FT ADLT (ELECTROSURGICAL) ×3
ELECTRODE REM PT RTRN 9FT ADLT (ELECTROSURGICAL) ×1 IMPLANT
GAUZE SPONGE 4X4 12PLY STRL (GAUZE/BANDAGES/DRESSINGS) ×3 IMPLANT
GAUZE XEROFORM 1X8 LF (GAUZE/BANDAGES/DRESSINGS) ×3 IMPLANT
GLOVE BIO SURGEON STRL SZ8 (GLOVE) ×6 IMPLANT
GLOVE INDICATOR 8.0 STRL GRN (GLOVE) ×3 IMPLANT
GOWN STRL REUS W/ TWL LRG LVL3 (GOWN DISPOSABLE) ×1 IMPLANT
GOWN STRL REUS W/ TWL XL LVL3 (GOWN DISPOSABLE) ×1 IMPLANT
GOWN STRL REUS W/TWL LRG LVL3 (GOWN DISPOSABLE) ×3
GOWN STRL REUS W/TWL XL LVL3 (GOWN DISPOSABLE) ×3
HEMOVAC 400ML (MISCELLANEOUS) ×3
K-WIRE ACE 1.6X6 (WIRE) ×6
KIT DRAIN HEMOVAC JP 7FR 400ML (MISCELLANEOUS) ×1 IMPLANT
KIT TURNOVER KIT A (KITS) ×3 IMPLANT
KWIRE ACE 1.6X6 (WIRE) ×2 IMPLANT
LABEL OR SOLS (LABEL) ×3 IMPLANT
NS IRRIG 1000ML POUR BTL (IV SOLUTION) ×3 IMPLANT
PACK EXTREMITY (MISCELLANEOUS) ×3 IMPLANT
PAD ABD DERMACEA PRESS 5X9 (GAUZE/BANDAGES/DRESSINGS) ×6 IMPLANT
PAD CAST CTTN 4X4 STRL (SOFTGOODS) ×2 IMPLANT
PAD PREP 24X41 OB/GYN DISP (PERSONAL CARE ITEMS) ×3 IMPLANT
PADDING CAST COTTON 4X4 STRL (SOFTGOODS) ×6
PLATE LOCK 7H 92 BILAT FIB (Plate) ×3 IMPLANT
SCREW ACE CAN 4.0 40M (Screw) ×3 IMPLANT
SCREW ACE CAN 4.0 44M (Screw) ×3 IMPLANT
SCREW CORTICAL 3.5MM 24MM (Screw) ×3 IMPLANT
SCREW CORTICAL 3.5MM 26MM (Screw) ×3 IMPLANT
SCREW CORTICAL LOW PROF 3.5X20 (Screw) ×3 IMPLANT
SCREW LOCK CORT STAR 3.5X10 (Screw) ×3 IMPLANT
SCREW LOCK CORT STAR 3.5X12 (Screw) ×3 IMPLANT
SCREW LOCK CORT STAR 3.5X14 (Screw) ×3 IMPLANT
SCREW LOW PROFILE 18MMX3.5MM (Screw) ×3 IMPLANT
SCREW NON LOCKING LP 3.5 16MM (Screw) ×3 IMPLANT
SPONGE LAP 18X18 RF (DISPOSABLE) ×3 IMPLANT
STAPLER SKIN PROX 35W (STAPLE) ×3 IMPLANT
STOCKINETTE IMPERV 14X48 (MISCELLANEOUS) ×3 IMPLANT
SUT VIC AB 0 CT1 36 (SUTURE) IMPLANT
SUT VIC AB 2-0 SH 27 (SUTURE) ×6
SUT VIC AB 2-0 SH 27XBRD (SUTURE) ×2 IMPLANT
SYR 10ML LL (SYRINGE) ×3 IMPLANT

## 2020-02-05 NOTE — Anesthesia Procedure Notes (Signed)
Procedure Name: LMA Insertion Date/Time: 02/05/2020 11:40 AM Performed by: Chanetta Marshall, CRNA Pre-anesthesia Checklist: Patient identified, Emergency Drugs available, Suction available and Patient being monitored Patient Re-evaluated:Patient Re-evaluated prior to induction Oxygen Delivery Method: Circle system utilized Preoxygenation: Pre-oxygenation with 100% oxygen Induction Type: IV induction Ventilation: Mask ventilation without difficulty LMA: LMA inserted LMA Size: 4.0 Tube type: Oral Number of attempts: 1 Placement Confirmation: positive ETCO2,  breath sounds checked- equal and bilateral and CO2 detector Tube secured with: Tape Dental Injury: Teeth and Oropharynx as per pre-operative assessment

## 2020-02-05 NOTE — Consult Note (Signed)
ORTHOPAEDIC CONSULTATION  REQUESTING PHYSICIAN: Nolberto Hanlon, MD  Chief Complaint:   Left ankle pain.  History of Present Illness: Joe Blankenship is a 69 y.o. male with a medical history notable for hypertension, hyperlipidemia, gout, gastroesophageal reflux disease, sleep apnea, and melanoma who lives independently with his wife.  The patient was in his usual state of health yesterday afternoon when he apparently tripped and fell over the threshold of his door, injuring his left ankle.  He was brought to the emergency room where x-rays demonstrated a trimalleolar fracture dislocation of the ankle.  The ER provider attempted to reduce the ankle and splinted it.  The patient was admitted in anticipation of definitive management of the injury.  The patient denies any associated injuries.  He did not strike his head or lose consciousness.  The patient also denies any lightheadedness, dizziness, chest pain, shortness of breath, or other symptoms which may have precipitated his fall.  Past Medical History:  Diagnosis Date  . GERD (gastroesophageal reflux disease)   . Gout   . Hyperlipidemia   . Hypertension   . Melanoma ([place])   . Melanoma ([place])   . Obstructive sleep apnea   . Tubular adenoma    Past Surgical History:  Procedure Laterality Date  . BASAL CELL CARCINOMA EXCISION    . COLONOSCOPY WITH PROPOFOL N/A 04/17/2016   Procedure: COLONOSCOPY WITH PROPOFOL;  Surgeon: Jonathon Bellows, MD;  Location: [facility] ENDOSCOPY;  Service: Endoscopy;  Laterality: N/A;  . MELANOMA EXCISION     x 2   Social History   Socioeconomic History  . Marital status: Single    Spouse name: Not on file  . Number of children: 0  . Years of education: Not on file  . Highest education level: High school graduate  Occupational History    Employer: [employer]    Comment: full time  Tobacco Use  . Smoking status: Former Smoker    Years: 14.00    Quit  date: 05/18/1977    Years since quitting: 42.7  . Smokeless tobacco: Never Used  Substance and Sexual Activity  . Alcohol use: Yes    Alcohol/week: 3.0 standard drinks    Types: 3 Cans of beer per week  . Drug use: No  . Sexual activity: Not Currently  Other Topics Concern  . Not on file  Social History Narrative  . Not on file   Social Determinants of Health   Financial Resource Strain: Low Risk   . Difficulty of Paying Living Expenses: Not hard at all  Food Insecurity: No Food Insecurity  . Worried About Charity fundraiser in the Last Year: Never true  . Ran Out of Food in the Last Year: Never true  Transportation Needs: No Transportation Needs  . Lack of Transportation (Medical): No  . Lack of Transportation (Non-Medical): No  Physical Activity: Inactive  . Days of Exercise per Week: 0 days  . Minutes of Exercise per Session: 0 min  Stress: No Stress Concern Present  . Feeling of Stress : Not at all  Social Connections: Unknown  . Frequency of Communication with Friends and Family: Patient refused  . Frequency of Social Gatherings with Friends and Family: Patient refused  . Attends Religious Services: Patient refused  . Active Member of Clubs or Organizations: Patient refused  . Attends Archivist Meetings: Patient refused  . Marital Status: Patient refused   Family History  Problem Relation Age of Onset  . Pancreatitis Mother   .  Heart disease Father   . Hyperlipidemia Father   . Hypertension Father   . Heart attack Father    No Known Allergies Prior to Admission medications   Medication Sig Start Date End Date Taking? Authorizing Provider  allopurinol (ZYLOPRIM) 300 MG tablet Take 300 mg by mouth at bedtime.    Yes [provider]  aspirin 81 MG tablet Take 81 mg by mouth daily.  01/11/13  Yes [provider]  Olmesartan-amLODIPine-HCTZ 40-10-25 MG TABS TAKE 1 TABLET BY MOUTH DAILY Patient taking differently: Take 1 tablet by mouth  daily.  02/16/19  Yes Jerrol Banana., MD  omeprazole (PRILOSEC) 20 MG capsule Take 20 mg by mouth daily.   Yes [provider]  pravastatin (PRAVACHOL) 40 MG tablet TAKE 1 TABLET BY MOUTH EVERY DAY Patient taking differently: Take 40 mg by mouth at bedtime.  10/15/19  Yes Jerrol Banana., MD  traZODone (DESYREL) 150 MG tablet Take 1 tablet (150 mg total) by mouth at bedtime. Patient not taking: Reported on 02/05/2020 10/26/19   Jerrol Banana., MD   DG Ankle 2 Views Left  Result Date: 02/04/2020 CLINICAL DATA:  Post reduction views. EXAM: LEFT ANKLE - 2 VIEW COMPARISON:  February 04, 2020 (8:11 p.m.) FINDINGS: The left ankle was imaged in a fiberglass cast with subsequently obscured osseous and soft tissue detail. Acute fractures of the medial, posterior and lateral malleoli are again noted. There is no evidence of dislocation. Mild to moderate severity lateral soft tissue swelling is seen. IMPRESSION: Status post reduction of trimalleolar fractures of the left ankle. Electronically Signed   By: Virgina Norfolk M.D.   On: 02/04/2020 22:26   DG Ankle Complete Left  Result Date: 02/04/2020 CLINICAL DATA:  Left ankle pain after twisting injury down stairs EXAM: LEFT ANKLE COMPLETE - 3+ VIEW COMPARISON:  None. FINDINGS: Mild diffuse left ankle soft tissue swelling. Oblique lateral malleolus fracture with 6 mm posterolateral displacement of the distal fracture fragment. Transverse medial malleolus fracture with 6 mm lateral displacement of the distal fracture fragment. Suspected nondisplaced posterior malleolar fracture in the posterior distal left tibia. Lateral 6 mm subluxation of the talus relative at the ankle mortise. No focal osseous lesions. No radiopaque foreign bodies. Small Achilles and plantar left calcaneal spurs. Vascular calcifications in the soft tissues. IMPRESSION: Displaced medial and lateral malleolus fractures with suspected nondisplaced posterior malleolus  fracture. Lateral talar 6 mm subluxation at the ankle mortise. Electronically Signed   By: Ilona Sorrel M.D.   On: 02/04/2020 20:25   Chest Portable 1 View  Result Date: 02/04/2020 CLINICAL DATA:  Preoperative examination. EXAM: PORTABLE CHEST 1 VIEW COMPARISON:  None. FINDINGS: The heart size and mediastinal contours are within normal limits. Both lungs are clear. The visualized skeletal structures are unremarkable. IMPRESSION: No active disease. Electronically Signed   By: Fidela Salisbury MD   On: 02/04/2020 22:47    Positive ROS: All other systems have been reviewed and were otherwise negative with the exception of those mentioned in the HPI and as above.  Physical Exam: General:  Alert, no acute distress Psychiatric:  Patient is competent for consent with normal mood and affect   Cardiovascular:  No pedal edema Respiratory:  No wheezing, non-labored breathing GI:  Abdomen is soft and non-tender Skin:  No lesions in the area of chief complaint Neurologic:  Sensation intact distally Lymphatic:  No axillary or cervical lymphadenopathy  Orthopedic Exam:  Orthopedic examination is limited to the left  lower extremity and foot.  The patient is in a posterior splint with sugar tong supplement.  The splint appears to be in good condition and the ankle appears to be satisfactorily aligned.  Skin inspection shows a superficial laceration on the plantar aspect of the great toe but otherwise is unremarkable.  No significant swelling, erythema, ecchymosis, abrasions, or other skin abnormalities are identified either proximal or distal to the splint.  He is able dorsiflex and plantarflex his toes.  Sensation is intact light touch to all digits, including the first webspace.  He has good capillary refill to all digits.  X-rays:  Recent pre and post reduction films of the left ankle are available for review and have been reviewed by myself.  These films demonstrate a trimalleolar fracture dislocation with  residual posterior subluxation of the mortise.  No acute degenerative changes are identified.  No lytic lesions or other bony abnormalities are noted.  Assessment: Closed trimalleolar fracture dislocation left ankle.  Plan: The treatment options, including both surgical and nonsurgical choices, have been discussed in detail with the patient. The risks (including bleeding, infection, nerve and/or blood vessel injury, persistent or recurrent pain, loosening or failure of the components, leg length inequality, dislocation, need for further surgery, blood clots, strokes, heart attacks or arrhythmias, pneumonia, etc.) and benefits of the surgical procedure were discussed. The patient states his understanding and agrees to proceed. A formal written consent will be obtained by the nursing staff.  Thank you for asking me to participate in the care of this most pleasant man.  I will be happy to follow him with you.   Pascal Lux, MD  Beeper #:  [phone]  02/05/2020 11:04 AM

## 2020-02-05 NOTE — Progress Notes (Signed)
15 minute call to floor. 

## 2020-02-05 NOTE — Op Note (Signed)
02/05/2020  2:24 PM  Patient:   Joe Blankenship  Pre-Op Diagnosis:   Closed trimalleolar fracture dislocation, left ankle.  Post-Op Diagnosis:   Same.  Procedure:   Open reduction and internal fixation of displaced trimalleolar fracture dislocation, left ankle.  Surgeon:   Pascal Lux, MD  Assistant:   None  Anesthesia:   General LMA  Findings:   As above.  Complications:   None  EBL:   25 cc  Fluids:   500 cc crystalloid  UOP:   None  TT:   89 min at 250 mmHg  Drains:   None  Closure:   Staples  Implants:   Biomet ALPS 7-hole composite locking plate and screws  Brief Clinical Note:   The patient is a 69 year old male who sustained the above-noted injury last evening when he tripped over the threshold of his door and twisted his ankle. He presented to the emergency room where x-rays demonstrated a trimalleolar fracture dislocation of the left ankle. The fracture was reduced and splinted in the emergency room. The patient presents at this time for definitive management of his/her injury.  Procedure:   The patient was brought into the operating room and lain in the supine position. After adequate general laryngeal mask anesthesia was obtained, the left foot and lower leg were prepped with ChloraPrep solution, then draped sterilely. Preoperative antibiotics were administered. A timeout was performed to verify the appropriate surgical site before the limb was exsanguinated with an Esmarch and the calf tourniquet inflated to 250 mmHg.   Laterally, an 8-10 cm incision was made over the lateral aspect of the distal fibula. The incision was carried down through the subcutaneous tissues to expose the fracture site. The fracture hematoma was debrided before the fracture was reduced and temporarily secured using a bone clamp. Two lag screws were placed in an anterior to posterior direction perpendicular to the fracture. A 7-hole Biomet composite locking plate was contoured using the  appropriate plate benders before it was applied over the lateral aspect of the distal fibula. After verifying its position fluoroscopically, it was secured using a 3.5 mm nonlocking cortical screw proximal to the fracture. Again the plate's position was adjusted slightly based on AP and lateral projections before it was secured using additional bicortical screws proximally and multiple locking screws distally. The adequacy of fracture reduction and hardware position was verified fluoroscopically in AP and lateral projections and found to be excellent.   Attention was directed to the medial side. An approximately 3 cm longitudinal incision was made over the anterior and distal portions of the medial malleolus. This incision also was carried down through the subcutaneous tissues to expose the fracture site. Care was taken to identify and protect the saphenous nerve and vein. The fracture hematoma again was removed before the fracture was reduced. The fracture fragment was quite small so a single guidewire was placed obliquely across the fracture from distal to proximal into the distal tibial metaphysis. After verifying its position fluoroscopically, the guidewire was sequentially over-reamed and replaced with a 40 mm partially threaded 4.0 cancellous screw in lag fashion. Again the adequacy of fracture reduction, hardware position, and mortise restoration was verified in AP, lateral, and oblique projections and found to be excellent.  Finally, the displaced posterior malleolar fragment was addressed. On the lateral view, the posterior malleolar fragment appeared to involve greater than 25% of the articular margin so it was felt best to stabilize this fracture. With the ankle in neutral dorsiflexion, the  fracture reduced quite nicely. Therefore, it was stabilized with a single anterior to posteriorly directed partially-threaded cannulated screw placed just proximal and parallel to the mortise. This screw was placed  through an anterior stab incision. Good purchase was achieved on the posterior cortex and excellent compression achieved across the fracture site. The adequacy of reduction and hardware position was verified fluoroscopically in AP, lateral, and oblique projections and found to be excellent.  Each wound was copiously irrigated with sterile saline solution. Laterally, the subcutaneous tissues were closed in two layers using 2-0 Vicryl interrupted sutures before the skin was closed using staples. Medially and anteriorly, the subcutaneous tissues were closed using 3-0 Vicryl interrupted sutures before the skin was closed using staples. A total of 30 cc of 0.5% plain Sensorcaine was injected in and around the incision sites to help with postoperative analgesia. Sterile bulky dressings were applied to the wounds before the patient was placed into a posterior splint with a sugar tong supplement, maintaining the ankle in neutral dorsiflexion. The patient was then awakened, extubated, and returned to the recovery room in satisfactory condition after tolerating the procedure well.

## 2020-02-05 NOTE — Progress Notes (Signed)
PROGRESS NOTE    Joe Blankenship  OHY:[id number] DOB: July 02, 1950 DOA: 02/04/2020 PCP: Jerrol Banana., MD    Brief Narrative:  Joe Blankenship is a 69 y.o. male with medical history significant for HTN, OSA, gouty arthritis, HLD who was in his usual state of health when he tripped on the threshold of the door falling and injuring his left ankle.  He denied preceding lightheadedness, dizziness, visual disturbance, one-sided weakness numbness or tingling or preceding chest pain, palpitations or shortness of breath.  He denied recent illness.  Denies cough, fever or chills.  He noted immediate pain swelling and the feeling of unsteadiness of the left ankle. ED Course: On arrival in the emergency room, vitals were within normal limits.  Initial blood work unremarkable except for slightly elevated WBC of 11,000.  Ankle x-ray with displaced bimalleolar fracture with subluxation of the ankle mortise.  Chest x-ray clear.  EKG pending the emergency room provider spoke with orthopedist Dr. Roland Rack who recommended admission for ORIF in the a.m.  Hospitalist consulted for admission.    Consultants:   Ortho  Procedures: Status post ORIF on 9/19 by Dr. Roland Rack  Antimicrobials:   Cefazolin x1   Subjective: Has no complaints this AM.  Patient was seen prior to going to the OR this AM.  Objective: Vitals:   02/05/20 1406 02/05/20 1421 02/05/20 1445 02/05/20 1521  BP: (!) 101/59 132/68 124/75 121/73  Pulse: (!) 58 72 72 68  Resp: 14 11 17 17   Temp:   98.6 F (37 C) 98.6 F (37 C)  TempSrc:   Oral   SpO2: 95% 96% 96% 93%  Weight:      Height:        Intake/Output Summary (Last 24 hours) at 02/05/2020 1619 Last data filed at 02/05/2020 1327 Gross per 24 hour  Intake 887.72 ml  Output 805 ml  Net 82.72 ml   Filed Weights   02/04/20 1938  Weight: 90.7 kg    Examination:  General exam: Appears calm and comfortable  Respiratory system: Clear to auscultation. Respiratory effort  normal. Cardiovascular system: S1 & S2 heard, RRR. No JVD, murmurs, rubs, gallops or clicks. Gastrointestinal system: Abdomen is nondistended, soft and nontender.  Normal bowel sounds heard. Central nervous system: Alert and oriented.  Grossly intact Extremities: No edema Skin: Warm dry Psychiatry: Judgement and insight appear normal. Mood & affect appropriate.     Data Reviewed: I have personally reviewed following labs and imaging studies  CBC: Recent Labs  Lab 02/04/20 2026  WBC 11.0*  NEUTROABS 7.6  HGB 14.3  HCT 41.6  MCV 99.0  PLT 948   Basic Metabolic Panel: Recent Labs  Lab 02/04/20 2026  NA 137  K 3.8  CL 101  CO2 21*  GLUCOSE 82  BUN 14  CREATININE 0.93  CALCIUM 9.1   GFR: Estimated Creatinine Clearance: 82.5 mL/min (by C-G formula based on SCr of 0.93 mg/dL). Liver Function Tests: No results for input(s): AST, ALT, ALKPHOS, BILITOT, PROT, ALBUMIN in the last 168 hours. No results for input(s): LIPASE, AMYLASE in the last 168 hours. No results for input(s): AMMONIA in the last 168 hours. Coagulation Profile: No results for input(s): INR, PROTIME in the last 168 hours. Cardiac Enzymes: No results for input(s): CKTOTAL, CKMB, CKMBINDEX, TROPONINI in the last 168 hours. BNP (last 3 results) No results for input(s): PROBNP in the last 8760 hours. HbA1C: No results for input(s): HGBA1C in the last 72 hours. CBG: No results for input(s):  GLUCAP in the last 168 hours. Lipid Profile: No results for input(s): CHOL, HDL, LDLCALC, TRIG, CHOLHDL, LDLDIRECT in the last 72 hours. Thyroid Function Tests: No results for input(s): TSH, T4TOTAL, FREET4, T3FREE, THYROIDAB in the last 72 hours. Anemia Panel: No results for input(s): VITAMINB12, FOLATE, FERRITIN, TIBC, IRON, RETICCTPCT in the last 72 hours. Sepsis Labs: No results for input(s): PROCALCITON, LATICACIDVEN in the last 168 hours.  Recent Results (from the past 240 hour(s))  SARS Coronavirus 2 by RT PCR  (hospital order, performed in [facility] hospital lab) Nasopharyngeal Nasopharyngeal Swab     Status: None   Collection Time: 02/04/20  8:26 PM   Specimen: Nasopharyngeal Swab  Result Value Ref Range Status   SARS Coronavirus 2 NEGATIVE NEGATIVE Final    Comment: (NOTE) SARS-CoV-2 target nucleic acids are NOT DETECTED.  The SARS-CoV-2 RNA is generally detectable in upper and lower respiratory specimens during the acute phase of infection. The lowest concentration of SARS-CoV-2 viral copies this assay can detect is 250 copies / mL. A negative result does not preclude SARS-CoV-2 infection and should not be used as the sole basis for treatment or other patient management decisions.  A negative result may occur with improper specimen collection / handling, submission of specimen other than nasopharyngeal swab, presence of viral mutation(s) within the areas targeted by this assay, and inadequate number of viral copies (<250 copies / mL). A negative result must be combined with clinical observations, patient history, and epidemiological information.  Fact Sheet for Patients:   StrictlyIdeas.no  Fact Sheet for Healthcare Providers: BankingDealers.co.za  This test is not yet approved or  cleared by the [place] FDA and has been authorized for detection and/or diagnosis of SARS-CoV-2 by FDA under an Emergency Use Authorization (EUA).  This EUA will remain in effect (meaning this test can be used) for the duration of the COVID-19 declaration under Section 564(b)(1) of the Act, 21 U.S.C. section 360bbb-3(b)(1), unless the authorization is terminated or revoked sooner.  Performed at [facility], [address], [place], [place] [address]          Radiology Studies: DG Ankle 2 Views Left  Result Date: 02/05/2020 CLINICAL DATA:  Fixation left ankle fracture. EXAM: LEFT ANKLE - 2 VIEW; DG C-ARM 1-60 MIN COMPARISON:   02/04/2020 FINDINGS: Fixation of patient's distal fibular fracture with lateral fixation plate and screws with hardware intact and anatomic alignment over the fracture site. Ankle mortise is normal. Two orthopedic screws over the distal tibia fixating patient's posterior and medial malleolar fractures with hardware intact and anatomic alignment over the fracture sites. IMPRESSION: Hardware intact and anatomic alignment over the distal tibia and fibular fractures. Electronically Signed   By: Marin Olp M.D.   On: 02/05/2020 14:41   DG Ankle 2 Views Left  Result Date: 02/04/2020 CLINICAL DATA:  Post reduction views. EXAM: LEFT ANKLE - 2 VIEW COMPARISON:  February 04, 2020 (8:11 p.m.) FINDINGS: The left ankle was imaged in a fiberglass cast with subsequently obscured osseous and soft tissue detail. Acute fractures of the medial, posterior and lateral malleoli are again noted. There is no evidence of dislocation. Mild to moderate severity lateral soft tissue swelling is seen. IMPRESSION: Status post reduction of trimalleolar fractures of the left ankle. Electronically Signed   By: Virgina Norfolk M.D.   On: 02/04/2020 22:26   DG Ankle Complete Left  Result Date: 02/04/2020 CLINICAL DATA:  Left ankle pain after twisting injury down stairs EXAM: LEFT ANKLE COMPLETE -  3+ VIEW COMPARISON:  None. FINDINGS: Mild diffuse left ankle soft tissue swelling. Oblique lateral malleolus fracture with 6 mm posterolateral displacement of the distal fracture fragment. Transverse medial malleolus fracture with 6 mm lateral displacement of the distal fracture fragment. Suspected nondisplaced posterior malleolar fracture in the posterior distal left tibia. Lateral 6 mm subluxation of the talus relative at the ankle mortise. No focal osseous lesions. No radiopaque foreign bodies. Small Achilles and plantar left calcaneal spurs. Vascular calcifications in the soft tissues. IMPRESSION: Displaced medial and lateral malleolus  fractures with suspected nondisplaced posterior malleolus fracture. Lateral talar 6 mm subluxation at the ankle mortise. Electronically Signed   By: Ilona Sorrel M.D.   On: 02/04/2020 20:25   Chest Portable 1 View  Result Date: 02/04/2020 CLINICAL DATA:  Preoperative examination. EXAM: PORTABLE CHEST 1 VIEW COMPARISON:  None. FINDINGS: The heart size and mediastinal contours are within normal limits. Both lungs are clear. The visualized skeletal structures are unremarkable. IMPRESSION: No active disease. Electronically Signed   By: Fidela Salisbury MD   On: 02/04/2020 22:47   DG C-Arm 1-60 Min  Result Date: 02/05/2020 CLINICAL DATA:  Fixation left ankle fracture. EXAM: LEFT ANKLE - 2 VIEW; DG C-ARM 1-60 MIN COMPARISON:  02/04/2020 FINDINGS: Fixation of patient's distal fibular fracture with lateral fixation plate and screws with hardware intact and anatomic alignment over the fracture site. Ankle mortise is normal. Two orthopedic screws over the distal tibia fixating patient's posterior and medial malleolar fractures with hardware intact and anatomic alignment over the fracture sites. IMPRESSION: Hardware intact and anatomic alignment over the distal tibia and fibular fractures. Electronically Signed   By: Marin Olp M.D.   On: 02/05/2020 14:41   DG C-Arm 1-60 Min  Result Date: 02/05/2020 CLINICAL DATA:  Fixation left ankle fracture. EXAM: LEFT ANKLE - 2 VIEW; DG C-ARM 1-60 MIN COMPARISON:  02/04/2020 FINDINGS: Fixation of patient's distal fibular fracture with lateral fixation plate and screws with hardware intact and anatomic alignment over the fracture site. Ankle mortise is normal. Two orthopedic screws over the distal tibia fixating patient's posterior and medial malleolar fractures with hardware intact and anatomic alignment over the fracture sites. IMPRESSION: Hardware intact and anatomic alignment over the distal tibia and fibular fractures. Electronically Signed   By: Marin Olp M.D.   On:  02/05/2020 14:41        Scheduled Meds: Continuous Infusions: . sodium chloride 75 mL/hr at 02/05/20 0500  .  ceFAZolin (ANCEF) IV      Assessment & Plan:   Principal Problem:   Left trimalleolar fracture Active Problems:   Essential (primary) hypertension   Obstructive apnea   HLD (hyperlipidemia)   Gouty arthritis   Accidental fall   Preoperative clearance   69 year old male with history of HTN, OSA, gouty arthritis, HLD presenting with mechanical fall sustaining left trimalleolar fracture  Left trimalleolar fracture-s/p Accidental fall Status post ORIF by Dr. Roland Rack today Pain management Bowel regimen PT    Essential (primary) hypertension -Continue IV hydralazine since BP currently low.    Obstructive apnea  CPAP if desired    HLD (hyperlipidemia) -On statin will resume once oral intake is reestablished      Gouty arthritis -We will resume allopurinol   DVT prophylaxis: SCD Code Status: Full Family Communication: None at bedside  Status is: Inpatient  Remains inpatient appropriate because:Ongoing diagnostic testing needed not appropriate for outpatient work up   Dispo: The patient is from: Home  Anticipated d/c is to: Home              Anticipated d/c date is: 1 day              Patient currently is not medically stable to d/c.Had surgery today            LOS: 1 day   Time spent: 35 minutes with more than 50% on [place], MD [facility] Pager [phone]  If 7PM-7AM, please contact night-coverage [email or web address] Password [email or web address] 02/05/2020, 4:19 PM

## 2020-02-05 NOTE — Anesthesia Preprocedure Evaluation (Signed)
Anesthesia Evaluation  Patient identified by MRN, date of birth, ID band Patient awake    Reviewed: Allergy & Precautions, H&P , NPO status , Patient's Chart, lab work & pertinent test results  History of Anesthesia Complications Negative for: history of anesthetic complications  Airway Mallampati: III  TM Distance: <3 FB Neck ROM: limited    Dental  (+) Chipped, Poor Dentition, Missing   Pulmonary neg shortness of breath, sleep apnea , former smoker,    Pulmonary exam normal        Cardiovascular Exercise Tolerance: Good hypertension, (-) angina(-) Past MI and (-) DOE Normal cardiovascular exam     Neuro/Psych negative neurological ROS  negative psych ROS   GI/Hepatic Neg liver ROS, GERD  Medicated and Controlled,  Endo/Other  negative endocrine ROS  Renal/GU      Musculoskeletal  (+) Arthritis ,   Abdominal   Peds  Hematology negative hematology ROS (+)   Anesthesia Other Findings Past Medical History: No date: GERD (gastroesophageal reflux disease) No date: Gout No date: Hyperlipidemia No date: Hypertension No date: Melanoma ([place]) No date: Melanoma ([place]) No date: Obstructive sleep apnea No date: Tubular adenoma  Past Surgical History: No date: BASAL CELL CARCINOMA EXCISION 04/17/2016: COLONOSCOPY WITH PROPOFOL; N/A     Comment:  Procedure: COLONOSCOPY WITH PROPOFOL;  Surgeon: Jonathon Bellows, MD;  Location: [facility] ENDOSCOPY;  Service: Endoscopy;              Laterality: N/A; No date: MELANOMA EXCISION     Comment:  x 2  BMI    Body Mass Index: 28.70 kg/m      Reproductive/Obstetrics negative OB ROS                             Anesthesia Physical Anesthesia Plan  ASA: III  Anesthesia Plan: General LMA   Post-op Pain Management:    Induction: Intravenous  PONV Risk Score and Plan: Dexamethasone, Ondansetron, Midazolam and Treatment may vary due to age or  medical condition  Airway Management Planned: LMA  Additional Equipment:   Intra-op Plan:   Post-operative Plan: Extubation in OR  Informed Consent: I have reviewed the patients History and Physical, chart, labs and discussed the procedure including the risks, benefits and alternatives for the proposed anesthesia with the patient or authorized representative who has indicated his/her understanding and acceptance.     Dental Advisory Given  Plan Discussed with: Anesthesiologist, CRNA and Surgeon  Anesthesia Plan Comments: (Patient consented for risks of anesthesia including but not limited to:  - adverse reactions to medications - damage to eyes, teeth, lips or other oral mucosa - nerve damage due to positioning  - sore throat or hoarseness - Damage to heart, brain, nerves, lungs, other parts of body or loss of life  Patient voiced understanding.)        Anesthesia Quick Evaluation

## 2020-02-05 NOTE — Anesthesia Postprocedure Evaluation (Signed)
Anesthesia Post Note  Patient: Joe Blankenship  Procedure(s) Performed: OPEN REDUCTION INTERNAL FIXATION (ORIF) ANKLE FRACTURE (Left Ankle)  Patient location during evaluation: PACU Anesthesia Type: General Level of consciousness: awake and alert Pain management: pain level controlled Vital Signs Assessment: post-procedure vital signs reviewed and stable Respiratory status: spontaneous breathing, nonlabored ventilation, respiratory function stable and patient connected to nasal cannula oxygen Cardiovascular status: blood pressure returned to baseline and stable Postop Assessment: no apparent nausea or vomiting Anesthetic complications: no   No complications documented.   Last Vitals:  Vitals:   02/05/20 1406 02/05/20 1421  BP: (!) 101/59 132/68  Pulse: (!) 58 72  Resp: 14 11  Temp:    SpO2: 95% 96%    Last Pain:  Vitals:   02/05/20 1421  TempSrc:   PainSc: 0-No pain                 Precious Haws Wilmar Prabhakar

## 2020-02-05 NOTE — Transfer of Care (Signed)
Immediate Anesthesia Transfer of Care Note  Patient: Joe Blankenship  Procedure(s) Performed: OPEN REDUCTION INTERNAL FIXATION (ORIF) ANKLE FRACTURE (Left Ankle)  Patient Location: PACU  Anesthesia Type:General  Level of Consciousness: awake, alert  and oriented  Airway & Oxygen Therapy: Patient Spontanous Breathing  Post-op Assessment: Report given to RN and Post -op Vital signs reviewed and stable  Post vital signs: Reviewed and stable  Last Vitals:  Vitals Value Taken Time  BP    Temp    Pulse    Resp    SpO2      Last Pain:  Vitals:   02/05/20 0517  TempSrc: Oral  PainSc:       Patients Stated Pain Goal: 1 (00/76/22 6333)  Complications: No complications documented.

## 2020-02-06 ENCOUNTER — Encounter: Payer: Self-pay | Admitting: Surgery

## 2020-02-06 DIAGNOSIS — S82852S Displaced trimalleolar fracture of left lower leg, sequela: Secondary | ICD-10-CM

## 2020-02-06 LAB — CBC
HCT: 38.3 % — ABNORMAL LOW (ref 39.0–52.0)
Hemoglobin: 13.4 g/dL (ref 13.0–17.0)
MCH: 33.8 pg (ref 26.0–34.0)
MCHC: 35 g/dL (ref 30.0–36.0)
MCV: 96.7 fL (ref 80.0–100.0)
Platelets: 241 10*3/uL (ref 150–400)
RBC: 3.96 MIL/uL — ABNORMAL LOW (ref 4.22–5.81)
RDW: 13.2 % (ref 11.5–15.5)
WBC: 11.5 10*3/uL — ABNORMAL HIGH (ref 4.0–10.5)
nRBC: 0 % (ref 0.0–0.2)

## 2020-02-06 MED ORDER — HYDROCODONE-ACETAMINOPHEN 5-325 MG PO TABS: 1.0000 | ORAL_TABLET | ORAL | 0 refills | Status: AC | PRN

## 2020-02-06 MED ORDER — SENNOSIDES-DOCUSATE SODIUM 8.6-50 MG PO TABS: 1.0000 | ORAL_TABLET | Freq: Every evening | ORAL | 0 refills | Status: AC | PRN

## 2020-02-06 MED ORDER — TRAMADOL HCL 50 MG PO TABS
50.0000 mg | ORAL_TABLET | Freq: Four times a day (QID) | ORAL | 0 refills | Status: AC | PRN
Start: 2020-02-06 — End: ?

## 2020-02-06 MED ORDER — ENOXAPARIN SODIUM 40 MG/0.4ML ~~LOC~~ SOLN: 40.0000 mg | SUBCUTANEOUS | 0 refills | Status: AC

## 2020-02-06 MED ORDER — DOCUSATE SODIUM 100 MG PO CAPS: 100.0000 mg | ORAL_CAPSULE | Freq: Two times a day (BID) | ORAL | 0 refills | Status: AC

## 2020-02-06 NOTE — Progress Notes (Signed)
Ch visited with Pt as part of routine rounding. Pt sitting in chair. Ch introduced self, and asked about his stay. Pt reported that he was alright and was waiting to get discharged today. Ch let him know about Chaplain availability anytime before leaving.

## 2020-02-06 NOTE — Progress Notes (Signed)
Pt up to the bathroom IND with walker. Pt back to chair.. Scheduled meds given. Pt denies pain. Pt denies nay further needs at this time. Call bell within reach. RN number on pt's board

## 2020-02-06 NOTE — Progress Notes (Signed)
  Subjective: 1 Day Post-Op Procedure(s) (LRB): OPEN REDUCTION INTERNAL FIXATION (ORIF) ANKLE FRACTURE (Left) Patient reports pain as mild.   Patient is well, and has had no acute complaints or problems Plan is to go Home after hospital stay. Negative for chest pain and shortness of breath Fever: no Gastrointestinal: Negative for nausea and vomiting  Objective: Vital signs in last 24 hours: Temp:  [97.6 F (36.4 C)-98.7 F (37.1 C)] 97.6 F (36.4 C) (09/20 0346) Pulse Rate:  [58-97] 70 (09/20 0346) Resp:  [11-20] 18 (09/20 0346) BP: (95-163)/(59-98) 143/92 (09/20 0346) SpO2:  [92 %-98 %] 97 % (09/20 0500)  Intake/Output from previous day:  Intake/Output Summary (Last 24 hours) at 02/06/2020 0700 Last data filed at 02/06/2020 0348 Gross per 24 hour  Intake 600 ml  Output 2130 ml  Net -1530 ml    Intake/Output this shift: Total I/O In: -  Out: 1675 [Urine:1675]  Labs: Recent Labs    02/04/20 2026 02/06/20 0507  HGB 14.3 13.4   Recent Labs    02/04/20 2026 02/06/20 0507  WBC 11.0* 11.5*  RBC 4.20* 3.96*  HCT 41.6 38.3*  PLT 255 241   Recent Labs    02/04/20 2026  NA 137  K 3.8  CL 101  CO2 21*  BUN 14  CREATININE 0.93  GLUCOSE 82  CALCIUM 9.1   No results for input(s): LABPT, INR in the last 72 hours.   EXAM General - Patient is Alert and Oriented Extremity - Neurovascular intact Sensation intact distally Dorsiflexion/Plantar flexion intact Compartment soft Dressing/Incision - clean, dry, no drainage Motor Function - intact, moving foot and toes well on exam.   Past Medical History:  Diagnosis Date  . GERD (gastroesophageal reflux disease)   . Gout   . Hyperlipidemia   . Hypertension   . Melanoma ([place])   . Melanoma ([place])   . Obstructive sleep apnea   . Tubular adenoma     Assessment/Plan: 1 Day Post-Op Procedure(s) (LRB): OPEN REDUCTION INTERNAL FIXATION (ORIF) ANKLE FRACTURE (Left) Principal Problem:   Left trimalleolar  fracture Active Problems:   Essential (primary) hypertension   Obstructive apnea   HLD (hyperlipidemia)   Gouty arthritis   Accidental fall   Preoperative clearance  Estimated body mass index is 28.7 kg/m as calculated from the following:   Height as of this encounter: 5\' 10"  (1.778 m).   Weight as of this encounter: 90.7 kg. Advance diet Up with therapy D/C IV fluids  DC home after physical therapy per medicine. Follow-up at [facility] clinic orthopedics in 2 weeks for staple removal and splint change  DVT Prophylaxis - Lovenox Non Weight-Bearing to left leg  Reche Dixon, PA-C Orthopaedic Surgery 02/06/2020, 7:00 AM

## 2020-02-06 NOTE — Discharge Summary (Signed)
Duey Liller YBO:[id number] DOB: 03-28-1951 DOA: 02/04/2020  PCP: Jerrol Banana., MD  Admit date: 02/04/2020 Discharge date: 02/06/2020  Admitted From: home Disposition:  home  Recommendations for Outpatient Follow-up:  1. Follow up with PCP in 1 week 2. Please obtain BMP/CBC in one week 3. Dr. Roland Rack in 2 weeks    Discharge Condition:Stable CODE STATUS: Full Diet recommendation: Heart Healthy / Carb Modified / Regular / Dysphagia  Brief/Interim Summary: Hulbert Branscome is a 69 y.o. male with medical history significant for HTN, OSA, gouty arthritis, HLD who was in his usual state of health when he tripped on the threshold of the door falling and injuring his left ankle.  Ankle x-ray with displaced bimalleolar fracture with subluxation of the ankle mortise   Left trimalleolar fracture-s/pAccidental fall Status post ORIF of left ankle by Dr. Roland Rack 02/06/20 PT recommended walker.  Dc home with pain med, bowel med lovenox 14 days for ppx F/u with Dr. Roland Rack in 2 weeks  Essential (primary) hypertension Continue home meds on discharge  Obstructive apnea  CPAP if desired  HLD (hyperlipidemia) Continue statins   Gouty arthritis Continue allopurinol  Discharge Diagnoses:  Principal Problem:   Left trimalleolar fracture Active Problems:   Essential (primary) hypertension   Obstructive apnea   HLD (hyperlipidemia)   Gouty arthritis   Accidental fall   Preoperative clearance    Discharge Instructions  Discharge Instructions    Call MD for:  severe uncontrolled pain   Complete by: As directed    Call MD for:  temperature >100.4   Complete by: As directed    Diet - low sodium heart healthy   Complete by: As directed    Discharge instructions   Complete by: As directed    F/u with Dr. Roland Rack in 2 weeks   Discharge wound care:   Complete by: As directed    As directed by DR.Marland Kitchen Poggi   Increase activity slowly   Complete by: As directed       Allergies as of 02/06/2020   No Known Allergies     Medication List    TAKE these medications   allopurinol 300 MG tablet Commonly known as: ZYLOPRIM Take 300 mg by mouth at bedtime.   aspirin 81 MG tablet Take 81 mg by mouth daily.   docusate sodium 100 MG capsule Commonly known as: COLACE Take 1 capsule (100 mg total) by mouth 2 (two) times daily for 7 days.   enoxaparin 40 MG/0.4ML injection Commonly known as: LOVENOX Inject 0.4 mLs (40 mg total) into the skin daily for 14 doses.   HYDROcodone-acetaminophen 5-325 MG tablet Commonly known as: NORCO/VICODIN Take 1-2 tablets by mouth every 4 (four) hours as needed for up to 30 doses for moderate pain (pain score 4-6).   Olmesartan-amLODIPine-HCTZ 40-10-25 MG Tabs TAKE 1 TABLET BY MOUTH DAILY   omeprazole 20 MG capsule Commonly known as: PRILOSEC Take 20 mg by mouth daily.   pravastatin 40 MG tablet Commonly known as: PRAVACHOL TAKE 1 TABLET BY MOUTH EVERY DAY What changed: when to take this   senna-docusate 8.6-50 MG tablet Commonly known as: Senokot-S Take 1 tablet by mouth at bedtime as needed for up to 7 days for mild constipation.   traMADol 50 MG tablet Commonly known as: ULTRAM Take 1 tablet (50 mg total) by mouth every 6 (six) hours as needed for moderate pain.   traZODone 150 MG tablet Commonly known as: DESYREL Take 1 tablet (150 mg total) by mouth at  bedtime.            Durable Medical Equipment  (From admission, onward)         Start     Ordered   02/06/20 0918  For home use only DME Walker rolling  Once       Question Answer Comment  Walker: With 5 Inch Wheels   Patient needs a walker to treat with the following condition Weakness      02/06/20 0917           Discharge Care Instructions  (From admission, onward)         Start     Ordered   02/06/20 0000  Discharge wound care:       Comments: As directed by DR.Marland Kitchen Poggi   02/06/20 6387          Follow-up Information     Poggi, Marshall Cork, MD. Schedule an appointment as soon as possible for a visit in 2 week(s).   Specialty: Orthopedic Surgery Why: For staple removal and splint change Contact information: [place] [address] [phone]        Jerrol Banana., MD Follow up in 1 week(s).   Specialty: Family Medicine Why: for vaccination  Contact information: [address] [place] [address] [phone]              No Known Allergies  Consultations:  Orthopedics   Procedures/Studies: DG Ankle 2 Views Left  Result Date: 02/05/2020 CLINICAL DATA:  Fixation left ankle fracture. EXAM: LEFT ANKLE - 2 VIEW; DG C-ARM 1-60 MIN COMPARISON:  02/04/2020 FINDINGS: Fixation of patient's distal fibular fracture with lateral fixation plate and screws with hardware intact and anatomic alignment over the fracture site. Ankle mortise is normal. Two orthopedic screws over the distal tibia fixating patient's posterior and medial malleolar fractures with hardware intact and anatomic alignment over the fracture sites. IMPRESSION: Hardware intact and anatomic alignment over the distal tibia and fibular fractures. Electronically Signed   By: Marin Olp M.D.   On: 02/05/2020 14:41   DG Ankle 2 Views Left  Result Date: 02/04/2020 CLINICAL DATA:  Post reduction views. EXAM: LEFT ANKLE - 2 VIEW COMPARISON:  February 04, 2020 (8:11 p.m.) FINDINGS: The left ankle was imaged in a fiberglass cast with subsequently obscured osseous and soft tissue detail. Acute fractures of the medial, posterior and lateral malleoli are again noted. There is no evidence of dislocation. Mild to moderate severity lateral soft tissue swelling is seen. IMPRESSION: Status post reduction of trimalleolar fractures of the left ankle. Electronically Signed   By: Virgina Norfolk M.D.   On: 02/04/2020 22:26   DG Ankle Complete Left  Result Date: 02/04/2020 CLINICAL DATA:  Left  ankle pain after twisting injury down stairs EXAM: LEFT ANKLE COMPLETE - 3+ VIEW COMPARISON:  None. FINDINGS: Mild diffuse left ankle soft tissue swelling. Oblique lateral malleolus fracture with 6 mm posterolateral displacement of the distal fracture fragment. Transverse medial malleolus fracture with 6 mm lateral displacement of the distal fracture fragment. Suspected nondisplaced posterior malleolar fracture in the posterior distal left tibia. Lateral 6 mm subluxation of the talus relative at the ankle mortise. No focal osseous lesions. No radiopaque foreign bodies. Small Achilles and plantar left calcaneal spurs. Vascular calcifications in the soft tissues. IMPRESSION: Displaced medial and lateral malleolus fractures with suspected nondisplaced posterior malleolus fracture. Lateral talar 6 mm subluxation at the ankle mortise. Electronically Signed  By: Ilona Sorrel M.D.   On: 02/04/2020 20:25   Chest Portable 1 View  Result Date: 02/04/2020 CLINICAL DATA:  Preoperative examination. EXAM: PORTABLE CHEST 1 VIEW COMPARISON:  None. FINDINGS: The heart size and mediastinal contours are within normal limits. Both lungs are clear. The visualized skeletal structures are unremarkable. IMPRESSION: No active disease. Electronically Signed   By: Fidela Salisbury MD   On: 02/04/2020 22:47   DG C-Arm 1-60 Min  Result Date: 02/05/2020 CLINICAL DATA:  Fixation left ankle fracture. EXAM: LEFT ANKLE - 2 VIEW; DG C-ARM 1-60 MIN COMPARISON:  02/04/2020 FINDINGS: Fixation of patient's distal fibular fracture with lateral fixation plate and screws with hardware intact and anatomic alignment over the fracture site. Ankle mortise is normal. Two orthopedic screws over the distal tibia fixating patient's posterior and medial malleolar fractures with hardware intact and anatomic alignment over the fracture sites. IMPRESSION: Hardware intact and anatomic alignment over the distal tibia and fibular fractures. Electronically Signed    By: Marin Olp M.D.   On: 02/05/2020 14:41   DG C-Arm 1-60 Min  Result Date: 02/05/2020 CLINICAL DATA:  Fixation left ankle fracture. EXAM: LEFT ANKLE - 2 VIEW; DG C-ARM 1-60 MIN COMPARISON:  02/04/2020 FINDINGS: Fixation of patient's distal fibular fracture with lateral fixation plate and screws with hardware intact and anatomic alignment over the fracture site. Ankle mortise is normal. Two orthopedic screws over the distal tibia fixating patient's posterior and medial malleolar fractures with hardware intact and anatomic alignment over the fracture sites. IMPRESSION: Hardware intact and anatomic alignment over the distal tibia and fibular fractures. Electronically Signed   By: Marin Olp M.D.   On: 02/05/2020 14:41       Subjective: Has no complaints.  Is ready to go home.  Uncontrolled  Discharge Exam: Vitals:   02/06/20 0500 02/06/20 0736  BP:  (!) 146/89  Pulse:  82  Resp:  18  Temp:  97.7 F (36.5 C)  SpO2: 97% 94%   Vitals:   02/05/20 2346 02/06/20 0346 02/06/20 0500 02/06/20 0736  BP: (!) 151/82 (!) 143/92  (!) 146/89  Pulse: 82 70  82  Resp: 17 18  18   Temp: 98.3 F (36.8 C) 97.6 F (36.4 C)  97.7 F (36.5 C)  TempSrc: Oral Oral  Oral  SpO2: 97% 98% 97% 94%  Weight:      Height:        General: Pt is alert, awake, not in acute distress Cardiovascular: RRR, S1/S2 +, no rubs, no gallops Respiratory: CTA bilaterally, no wheezing, no rhonchi Abdominal: Soft, NT, ND, bowel sounds + Extremities: Left lower extremity wrapped in dressing, right no edema    The results of significant diagnostics from this hospitalization (including imaging, microbiology, ancillary and laboratory) are listed below for reference.     Microbiology: Recent Results (from the past 240 hour(s))  SARS Coronavirus 2 by RT PCR (hospital order, performed in [facility] hospital lab) Nasopharyngeal Nasopharyngeal Swab     Status: None   Collection Time: 02/04/20  8:26 PM   Specimen:  Nasopharyngeal Swab  Result Value Ref Range Status   SARS Coronavirus 2 NEGATIVE NEGATIVE Final    Comment: (NOTE) SARS-CoV-2 target nucleic acids are NOT DETECTED.  The SARS-CoV-2 RNA is generally detectable in upper and lower respiratory specimens during the acute phase of infection. The lowest concentration of SARS-CoV-2 viral copies this assay can detect is 250 copies / mL. A negative result does not preclude SARS-CoV-2 infection and  should not be used as the sole basis for treatment or other patient management decisions.  A negative result may occur with improper specimen collection / handling, submission of specimen other than nasopharyngeal swab, presence of viral mutation(s) within the areas targeted by this assay, and inadequate number of viral copies (<250 copies / mL). A negative result must be combined with clinical observations, patient history, and epidemiological information.  Fact Sheet for Patients:   StrictlyIdeas.no  Fact Sheet for Healthcare Providers: BankingDealers.co.za  This test is not yet approved or  cleared by the [place] FDA and has been authorized for detection and/or diagnosis of SARS-CoV-2 by FDA under an Emergency Use Authorization (EUA).  This EUA will remain in effect (meaning this test can be used) for the duration of the COVID-19 declaration under Section 564(b)(1) of the Act, 21 U.S.C. section 360bbb-3(b)(1), unless the authorization is terminated or revoked sooner.  Performed at [facility], [address] [place], [place] [address]      Labs: BNP (last 3 results) No results for input(s): BNP in the last 8760 hours. Basic Metabolic Panel: Recent Labs  Lab 02/04/20 2026  NA 137  K 3.8  CL 101  CO2 21*  GLUCOSE 82  BUN 14  CREATININE 0.93  CALCIUM 9.1   Liver Function Tests: No results for input(s): AST, ALT, ALKPHOS, BILITOT, PROT, ALBUMIN in the last 168  hours. No results for input(s): LIPASE, AMYLASE in the last 168 hours. No results for input(s): AMMONIA in the last 168 hours. CBC: Recent Labs  Lab 02/04/20 2026 02/06/20 0507  WBC 11.0* 11.5*  NEUTROABS 7.6  --   HGB 14.3 13.4  HCT 41.6 38.3*  MCV 99.0 96.7  PLT 255 241   Cardiac Enzymes: No results for input(s): CKTOTAL, CKMB, CKMBINDEX, TROPONINI in the last 168 hours. BNP: Invalid input(s): POCBNP CBG: No results for input(s): GLUCAP in the last 168 hours. D-Dimer No results for input(s): DDIMER in the last 72 hours. Hgb A1c No results for input(s): HGBA1C in the last 72 hours. Lipid Profile No results for input(s): CHOL, HDL, LDLCALC, TRIG, CHOLHDL, LDLDIRECT in the last 72 hours. Thyroid function studies No results for input(s): TSH, T4TOTAL, T3FREE, THYROIDAB in the last 72 hours.  Invalid input(s): FREET3 Anemia work up No results for input(s): VITAMINB12, FOLATE, FERRITIN, TIBC, IRON, RETICCTPCT in the last 72 hours. Urinalysis    Component Value Date/Time   BILIRUBINUR Negative 03/05/2016 1017   PROTEINUR Negative 03/05/2016 1017   UROBILINOGEN 0.2 03/05/2016 1017   NITRITE Negative 03/05/2016 1017   LEUKOCYTESUR Negative 03/05/2016 1017   Sepsis Labs Invalid input(s): PROCALCITONIN,  WBC,  LACTICIDVEN Microbiology Recent Results (from the past 240 hour(s))  SARS Coronavirus 2 by RT PCR (hospital order, performed in [place] hospital lab) Nasopharyngeal Nasopharyngeal Swab     Status: None   Collection Time: 02/04/20  8:26 PM   Specimen: Nasopharyngeal Swab  Result Value Ref Range Status   SARS Coronavirus 2 NEGATIVE NEGATIVE Final    Comment: (NOTE) SARS-CoV-2 target nucleic acids are NOT DETECTED.  The SARS-CoV-2 RNA is generally detectable in upper and lower respiratory specimens during the acute phase of infection. The lowest concentration of SARS-CoV-2 viral copies this assay can detect is 250 copies / mL. A negative result does not preclude  SARS-CoV-2 infection and should not be used as the sole basis for treatment or other patient management decisions.  A negative result may occur with improper specimen collection / handling, submission  of specimen other than nasopharyngeal swab, presence of viral mutation(s) within the areas targeted by this assay, and inadequate number of viral copies (<250 copies / mL). A negative result must be combined with clinical observations, patient history, and epidemiological information.  Fact Sheet for Patients:   StrictlyIdeas.no  Fact Sheet for Healthcare Providers: BankingDealers.co.za  This test is not yet approved or  cleared by the [place] FDA and has been authorized for detection and/or diagnosis of SARS-CoV-2 by FDA under an Emergency Use Authorization (EUA).  This EUA will remain in effect (meaning this test can be used) for the duration of the COVID-19 declaration under Section 564(b)(1) of the Act, 21 U.S.C. section 360bbb-3(b)(1), unless the authorization is terminated or revoked sooner.  Performed at [facility], [address] [place], [place] [address]      Time coordinating discharge: Over 30 minutes  SIGNED:   Nolberto Hanlon, MD  [facility] 02/06/2020, 9:24 AM Pager   If 7PM-7AM, please contact night-coverage [email or web address] Password [email or web address]

## 2020-02-06 NOTE — Progress Notes (Signed)
Patient PIVs removed per order prior to discharge  On right forearm and left forearm

## 2020-02-06 NOTE — Evaluation (Signed)
Physical Therapy Evaluation Patient Details Name: Joe Blankenship MRN: [id number] DOB: 1951/01/12 Today's Date: 02/06/2020   History of Present Illness  Joe Blankenship is 20yoM who [facility] on 9/[facility] after tripping over threshold at home. Pt presents now s/p ORIF of trimalleolar fracture. HTN, OSA, gouty arthritis, HLD.  Clinical Impression  Pt admitted with above diagnosis. Pt currently with functional limitations due to the deficits listed below (see "PT Problem List"). Upon entry, pt in bed, awake and agreeable to participate. Pt denies any pain during session. The pt is alert and oriented x4, pleasant, conversational, and generally a good historian. Pt requires no physical assist for basic mobility, supervision and cues for transfers and gait in order to teach patient his weight bearing restrictions. Functional mobility assessment demonstrates increased effort/time requirements, good tolerance, and need for physical assistance, whereas the patient performed these at a higher level of independence PTA. Explained that patient may benefit from kneeling scooter in future, but will need clearance for knee weight bearing from surgeon. Pt will benefit from skilled PT intervention to increase independence and safety with basic mobility in preparation for discharge to the venue listed below.       Follow Up Recommendations Follow surgeon's recommendation for DC plan and follow-up therapies    Equipment Recommendations  Rolling walker with 5" wheels    Recommendations for Other Services       Precautions / Restrictions Precautions Precautions: Fall Restrictions Weight Bearing Restrictions: Yes LLE Weight Bearing: Non weight bearing      Mobility  Bed Mobility Overal bed mobility: Independent                Transfers Overall transfer level: Independent Equipment used: Rolling walker (2 wheeled)             General transfer comment: maintains LLE NWB as  directed  Ambulation/Gait Ambulation/Gait assistance: Min guard Gait Distance (Feet): 120 Feet Assistive device: Rolling walker (2 wheeled) Gait Pattern/deviations: Step-to pattern     General Gait Details: 2-point hop-to gait  Stairs            Wheelchair Mobility    Modified Rankin (Stroke Patients Only)       Balance Overall balance assessment: Modified Independent                                           Pertinent Vitals/Pain Pain Assessment: No/denies pain    Home Living Family/patient expects to be discharged to:: Private residence Living Arrangements: Alone Available Help at Discharge: Friend(s);Neighbor Type of Home: House Home Access: Stairs to enter   Technical brewer of Steps: 1 Home Layout: One level Home Equipment: None      Prior Function                 Hand Dominance        Extremity/Trunk Assessment   Upper Extremity Assessment Upper Extremity Assessment: Overall WFL for tasks assessed    Lower Extremity Assessment Lower Extremity Assessment: Overall WFL for tasks assessed       Communication      Cognition Arousal/Alertness: Awake/alert Behavior During Therapy: WFL for tasks assessed/performed Overall Cognitive Status: Within Functional Limits for tasks assessed  General Comments      Exercises     Assessment/Plan    PT Assessment Patient needs continued PT services  PT Problem List Decreased activity tolerance;Decreased mobility;Decreased balance       PT Treatment Interventions DME instruction;Gait training;Stair training;Functional mobility training;Therapeutic activities;Therapeutic exercise;Balance training;Patient/family education    PT Goals (Current goals can be found in the Care Plan section)  Acute Rehab PT Goals Patient Stated Goal: Return to home PT Goal Formulation: With patient Time For Goal Achievement:  02/20/20 Potential to Achieve Goals: Good    Frequency BID   Barriers to discharge        Co-evaluation               AM-PAC PT "6 Clicks" Mobility  Outcome Measure Help needed turning from your back to your side while in a flat bed without using bedrails?: None Help needed moving from lying on your back to sitting on the side of a flat bed without using bedrails?: None Help needed moving to and from a bed to a chair (including a wheelchair)?: None Help needed standing up from a chair using your arms (e.g., wheelchair or bedside chair)?: None Help needed to walk in hospital room?: A Little Help needed climbing 3-5 steps with a railing? : A Little 6 Click Score: 22    End of Session Equipment Utilized During Treatment: Gait belt Activity Tolerance: Patient tolerated treatment well;No increased pain Patient left: in chair;with call bell/phone within reach Nurse Communication: Mobility status PT Visit Diagnosis: Difficulty in walking, not elsewhere classified (R26.2);Other abnormalities of gait and mobility (R26.89)    Time: 5726-2035 PT Time Calculation (min) (ACUTE ONLY): 22 min   Charges:   PT Evaluation $PT Eval Low Complexity: 1 Low PT Treatments $Gait Training: 8-22 mins        9:14 AM, 02/06/20 Etta Grandchild, PT, DPT Physical Therapist - [facility]  [phone] ([place])    Aizley Stenseth C 02/06/2020, 9:11 AM

## 2020-02-06 NOTE — Discharge Instructions (Signed)
INSTRUCTIONS AFTER Surgery  o Remove items at home which could result in a fall. This includes throw rugs or furniture in walking pathways o ICE to the affected joint every three hours while awake for 30 minutes at a time, for at least the first 3-5 days, and then as needed for pain and swelling.  Continue to use ice for pain and swelling. You may notice swelling that will progress down to the foot and ankle.  This is normal after surgery.  Elevate your leg when you are not up walking on it.   o Continue to use the breathing machine you got in the hospital (incentive spirometer) which will help keep your temperature down.  It is common for your temperature to cycle up and down following surgery, especially at night when you are not up moving around and exerting yourself.  The breathing machine keeps your lungs expanded and your temperature down.   DIET:  As you were doing prior to hospitalization, we recommend a well-balanced diet.  DRESSING / WOUND CARE / SHOWERING  Keep the splint dry and clean.  No changing of the bandages.  Follow-up at [facility] clinic orthopedics in 2 weeks for staple removal and splint change.  ACTIVITY  o Increase activity slowly as tolerated, but follow the weight bearing instructions below.   o No driving for 6 weeks or until further direction given by your physician.  You cannot drive while taking narcotics.  o No lifting or carrying greater than 10 lbs. until further directed by your surgeon. o Avoid periods of inactivity such as sitting longer than an hour when not asleep. This helps prevent blood clots.  o You may return to work once you are authorized by your doctor.     WEIGHT BEARING  Nonweightbearing on the left leg with a walker.   EXERCISES Gentle ambulation with nonweightbearing on the left  CONSTIPATION  Constipation is defined medically as fewer than three stools per week and severe constipation as less than one stool per week.  Even if you have  a regular bowel pattern at home, your normal regimen is likely to be disrupted due to multiple reasons following surgery.  Combination of anesthesia, postoperative narcotics, change in appetite and fluid intake all can affect your bowels.   YOU MUST use at least one of the following options; they are listed in order of increasing strength to get the job done.  They are all available over the counter, and you may need to use some, POSSIBLY even all of these options:    Drink plenty of fluids (prune juice may be helpful) and high fiber foods Colace 100 mg by mouth twice a day  Senokot for constipation as directed and as needed Dulcolax (bisacodyl), take with full glass of water  Miralax (polyethylene glycol) once or twice a day as needed.  If you have tried all these things and are unable to have a bowel movement in the first 3-4 days after surgery call either your surgeon or your primary doctor.    If you experience loose stools or diarrhea, hold the medications until you stool forms back up.  If your symptoms do not get better within 1 week or if they get worse, check with your doctor.  If you experience "the worst abdominal pain ever" or develop nausea or vomiting, please contact the office immediately for further recommendations for treatment.   ITCHING:  If you experience itching with your medications, try taking only a single pain pill,  or even half a pain pill at a time.  You can also use Benadryl over the counter for itching or also to help with sleep.   TED HOSE STOCKINGS:  Use stockings on both legs until for at least 2 weeks or as directed by physician office. They may be removed at night for sleeping.  MEDICATIONS:  See your medication summary on the "After Visit Summary" that nursing will review with you.  You may have some home medications which will be placed on hold until you complete the course of blood thinner medication.  It is important for you to complete the blood thinner  medication as prescribed.  PRECAUTIONS:  If you experience chest pain or shortness of breath - call 911 immediately for transfer to the hospital emergency department.   If you develop a fever greater that 101 F, purulent drainage from wound, increased redness or drainage from wound, foul odor from the wound/dressing, or calf pain - CONTACT YOUR SURGEON.                                                   FOLLOW-UP APPOINTMENTS:  If you do not already have a post-op appointment, please call the office for an appointment to be seen by your surgeon.  Guidelines for how soon to be seen are listed in your "After Visit Summary", but are typically between 1-4 weeks after surgery.  OTHER INSTRUCTIONS:     MAKE SURE YOU:  . Understand these instructions.  . Get help right away if you are not doing well or get worse.    Thank you for letting us be a part of your medical care team.  It is a privilege we respect greatly.  We hope these instructions will help you stay on track for a fast and full recovery!

## 2020-02-06 NOTE — TOC Initial Note (Signed)
Transition of Care [facility]) - Initial/Assessment Note    Patient Details  Name: Joe Blankenship MRN: [id number] Date of Birth: 12/27/1950  Transition of [facility] CM/SW Contact:    Anselm Pancoast, RN Phone Number: 02/06/2020, 2:48 PM  Clinical Narrative:                 Spoke to patient and offered home health services. Patient states he discussed it with PT and his doctor prior to discharge and decided he did not need any home health services. Patient states he has strong support system and has transportation to his follow up appointments on 10/7 and 03/08/20. Patient states he is doing well and has no needs or concerns at this time.         Patient Goals and CMS Choice        Expected Discharge Plan and Services           Expected Discharge Date: 02/06/20                                    Prior Living Arrangements/Services                       Activities of Daily Living      Permission Sought/Granted                  Emotional Assessment              Admission diagnosis:  Fall [W19.XXXA] Left trimalleolar fracture [J09.326Z] Trimalleolar fracture of ankle, closed, left, initial encounter [T24.580D] Patient Active Problem List   Diagnosis Date Noted  . Accidental fall 02/04/2020  . Preoperative clearance 02/04/2020  . Left trimalleolar fracture 02/04/2020  . Hyperglycemia 03/11/2017  . Gouty arthritis 03/05/2016  . Insomnia 03/05/2016  . Basal cell carcinoma 11/11/2014  . Essential (primary) hypertension 11/11/2014  . Malignant melanoma ([place]) 11/11/2014  . Obstructive apnea 11/11/2014  . HLD (hyperlipidemia) 11/11/2014   PCP:  Jerrol Banana., MD Pharmacy:   [id number] - [place], [place] - [phone] [place] [address] Phone: [phone] Fax: [phone]     Social Determinants of Health (SDOH) Interventions    Readmission Risk Interventions No flowsheet data found.

## 2020-02-07 ENCOUNTER — Telehealth: Payer: Self-pay

## 2020-02-07 NOTE — Telephone Encounter (Signed)
HFU scheduled 02/13/20 @ 10:40 AM.

## 2020-02-07 NOTE — Telephone Encounter (Signed)
Transition Care Management Follow-up Telephone Call  Date of discharge and from where: [facility] on 02/06/20  How have you been since you were released from the hospital? Doing ok, ankle is still sore but pt has been taking Tramadol and Norco for pain. Pt has been elevating his ankle and states the swelling has decreased. Declines drainage, bleeding, pain, fever or n/v/d.  Any questions or concerns? No   Items Reviewed:  Did the pt receive and understand the discharge instructions provided? Yes   Medications obtained and verified? No, declined reviewing at this time.  Any new allergies since your discharge? No   Dietary orders reviewed? No  Do you have support at home? Yes   Other (ie: DME, Home Health, etc): Pt was d/c with a walker.  Functional Questionnaire: (I = Independent and D = Dependent)  Bathing/Dressing- I   Meal Prep- I  Eating- I  Maintaining continence- I  Transferring/Ambulation- D, using a walker currently.  Managing Meds- I   Follow up appointments reviewed:    PCP Hospital f/u appt confirmed? Yes  scheduled to see Dr Rosanna Randy on 02/13/20 @ 10:40 AM.  [facility] f/u appt confirmed? Yes    Are transportation arrangements needed? No   If their condition worsens, is the pt aware to call  their PCP or go to the ED? Yes  Was the patient provided with contact information for the PCP's office or ED? Yes  Was the pt encouraged to call back with questions or concerns? Yes

## 2020-02-08 ENCOUNTER — Other Ambulatory Visit: Payer: Self-pay | Admitting: Family Medicine

## 2020-02-08 DIAGNOSIS — M109 Gout, unspecified: Secondary | ICD-10-CM

## 2020-02-08 NOTE — Telephone Encounter (Signed)
Requested medications are due for refill today?  Unknown - listed as historical medication.    Requested medications are on active medication list? Yes as a historical medication.    Last Refill:  Unknown.    Future visit scheduled?  Yes  in 5 days.    Notes to Clinic:  Unable to refill historical medications.

## 2020-02-10 NOTE — Progress Notes (Signed)
I,Tikia Skilton,acting as a scribe for [name], MD.,have documented all relevant documentation on the behalf of [name], MD,as directed by  [name], MD while in the presence of [name], MD.  Established patient visit   Patient: Joe Blankenship   DOB: [birth year]   69 y.o. Male  MRN: [id number] Visit Date: 02/13/2020  Today's healthcare provider: [name], MD   Chief Complaint  Patient presents with  . Hospitalization Follow-up   Subjective    HPI  Follow up Hospitalization  Patient was admitted to [facility] on 02/04/2020 and discharged on 02/06/2020. He was treated for;  Left trimalleolar fracture Treatment for this included; see notes in chart. Telephone follow up was done on 02/07/2020 He reports good compliance with treatment. He reports this condition is improved.  --------------------------------------------------------------------  Patient states he is feeling somewhat improved since hospital discharge.      Medications: Outpatient Medications Prior to Visit  Medication Sig  . allopurinol (ZYLOPRIM) 300 MG tablet TAKE 1 TABLET BY MOUTH EVERY DAY  . aspirin 81 MG tablet Take 81 mg by mouth daily.   Marland Kitchen docusate sodium (COLACE) 100 MG capsule Take 1 capsule (100 mg total) by mouth 2 (two) times daily for 7 days.  Marland Kitchen enoxaparin (LOVENOX) 40 MG/0.4ML injection Inject 0.4 mLs (40 mg total) into the skin daily for 14 doses.  Marland Kitchen HYDROcodone-acetaminophen (NORCO/VICODIN) 5-325 MG tablet Take 1-2 tablets by mouth every 4 (four) hours as needed for up to 30 doses for moderate pain (pain score 4-6).  . Olmesartan-amLODIPine-HCTZ 40-10-25 MG TABS TAKE 1 TABLET BY MOUTH DAILY (Patient taking differently: Take 1 tablet by mouth daily. )  . omeprazole (PRILOSEC) 20 MG capsule Take 20 mg by mouth daily.  . pravastatin (PRAVACHOL) 40 MG tablet TAKE 1 TABLET BY MOUTH EVERY DAY (Patient taking differently: Take 40 mg by mouth at bedtime. )  .  senna-docusate (SENOKOT-S) 8.6-50 MG tablet Take 1 tablet by mouth at bedtime as needed for up to 7 days for mild constipation.  . traMADol (ULTRAM) 50 MG tablet Take 1 tablet (50 mg total) by mouth every 6 (six) hours as needed for moderate pain.  . traZODone (DESYREL) 150 MG tablet Take 1 tablet (150 mg total) by mouth at bedtime. (Patient not taking: Reported on 02/05/2020)   No facility-administered medications prior to visit.    Review of Systems  Constitutional: Negative for appetite change, chills and fever.  Respiratory: Negative for chest tightness, shortness of breath and wheezing.   Cardiovascular: Negative for chest pain and palpitations.  Gastrointestinal: Negative for abdominal pain, nausea and vomiting.       Objective    BP 101/68 (BP Location: Left Arm, Cuff Size: Large)   Pulse 80   Resp 16   SpO2 99%  BP Readings from Last 3 Encounters:  02/13/20 101/68  02/06/20 (!) 146/89  01/17/20 108/70   Wt Readings from Last 3 Encounters:  02/04/20 200 lb (90.7 kg)  01/17/20 208 lb (94.3 kg)  10/26/19 209 lb 6.4 oz (95 kg)      Physical Exam Vitals reviewed.  Constitutional:      Appearance: He is well-developed.  HENT:     Head: Normocephalic and atraumatic.     Right Ear: External ear normal.     Left Ear: External ear normal.     Nose: Nose normal.  Eyes:     Conjunctiva/sclera: Conjunctivae normal.     Pupils: Pupils are equal,  round, and reactive to light.  Cardiovascular:     Rate and Rhythm: Normal rate and regular rhythm.     Heart sounds: Normal heart sounds.  Pulmonary:     Effort: Pulmonary effort is normal.     Breath sounds: Normal breath sounds.  Abdominal:     General: Bowel sounds are normal.     Palpations: Abdomen is soft.  Musculoskeletal:     Cervical back: Normal range of motion and neck supple.     Comments: Patient has a cast on his left lower leg  Skin:    General: Skin is warm and dry.  Neurological:     General: No focal  deficit present.     Mental Status: He is alert and oriented to person, place, and time. Mental status is at baseline.  Psychiatric:        Mood and Affect: Mood normal.        Behavior: Behavior normal.        Thought Content: Thought content normal.        Judgment: Judgment normal.       No results found for any visits on 02/13/20.  Assessment & Plan     1. Closed trimalleolar fracture of left ankle, sequela Patient still has 10 more days of Lovenox.  He states he is about to stop taking his hydrocodone.  He states he has been very constipated for the past week.  We talked about stopping the hydrocodone.  Have given Linzess 8 tablets to take 1 daily although it is not indicated for narcotic induced constipation I think it might help.  He is also to try GlycoLax.  Again, I think this will pass with stopping narcotics.  He is in no acute distress and having no pain.  Follow-up as needed.  2. Essential (primary) hypertension Recheck blood pressure today 101/68.  Asymptomatic  3. Accidental fall, sequela Trimalleolar fracture from tripping over his door frame at home.  4. Pure hypercholesterolemia   5.h/o Malignant melanoma, unspecified site ([place])    No follow-ups on file.      I, [name], MD, have reviewed all documentation for this visit. The documentation on 02/13/20 for the exam, diagnosis, procedures, and orders are all accurate and complete.    Richard Cranford Mon, MD  [facility] [phone] (phone) [phone] (fax)  [place]

## 2020-02-11 ENCOUNTER — Other Ambulatory Visit: Payer: Self-pay | Admitting: Family Medicine

## 2020-02-11 NOTE — Telephone Encounter (Signed)
Requested  medications are  due for refill today yes  Requested medications are on the active medication list yes  Last refill 8/29  Last visit 8/31  Notes to clinic Pt did not have lab work done that was ordered at the last visit 8/31 therefore failed protocol of labs within 360 days.

## 2020-02-13 ENCOUNTER — Encounter: Payer: Self-pay | Admitting: Family Medicine

## 2020-02-13 ENCOUNTER — Ambulatory Visit (INDEPENDENT_AMBULATORY_CARE_PROVIDER_SITE_OTHER): Payer: Medicare Other | Admitting: Family Medicine

## 2020-02-13 ENCOUNTER — Other Ambulatory Visit: Payer: Self-pay

## 2020-02-13 VITALS — BP 101/68 | HR 80 | Resp 16

## 2020-02-13 DIAGNOSIS — S82852S Displaced trimalleolar fracture of left lower leg, sequela: Secondary | ICD-10-CM | POA: Diagnosis not present

## 2020-02-13 DIAGNOSIS — I10 Essential (primary) hypertension: Secondary | ICD-10-CM | POA: Diagnosis not present

## 2020-02-13 DIAGNOSIS — E7800 Pure hypercholesterolemia, unspecified: Secondary | ICD-10-CM | POA: Diagnosis not present

## 2020-02-13 DIAGNOSIS — W19XXXS Unspecified fall, sequela: Secondary | ICD-10-CM

## 2020-02-13 DIAGNOSIS — C439 Malignant melanoma of skin, unspecified: Secondary | ICD-10-CM

## 2020-02-13 NOTE — Patient Instructions (Signed)
Try Linzess daily. Try over the counter Glycolax daily.

## 2020-03-10 ENCOUNTER — Other Ambulatory Visit: Payer: Self-pay | Admitting: Family Medicine

## 2020-04-07 ENCOUNTER — Other Ambulatory Visit: Payer: Self-pay | Admitting: Family Medicine

## 2020-04-07 DIAGNOSIS — G4700 Insomnia, unspecified: Secondary | ICD-10-CM

## 2020-04-30 NOTE — Progress Notes (Signed)
Subjective:   Joe Blankenship is a 69 y.o. male who presents for Medicare Annual/Subsequent preventive examination.  I connected with Hildred Priest today by telephone and verified that I am speaking with the correct person using two identifiers. Location patient: home Location provider: work Persons participating in the virtual visit: patient, provider.   I discussed the limitations, risks, security and privacy concerns of performing an evaluation and management service by telephone and the availability of in person appointments. I also discussed with the patient that there may be a patient responsible charge related to this service. The patient expressed understanding and verbally consented to this telephonic visit.    Interactive audio and video telecommunications were attempted between this provider and patient, however failed, due to patient having technical difficulties OR patient did not have access to video capability.  We continued and completed visit with audio only.   Review of Systems    N/A  Cardiac Risk Factors include: advanced age (>85men, >71 women);dyslipidemia;hypertension;male gender;sedentary lifestyle     Objective:    There were no vitals filed for this visit. There is no height or weight on file to calculate BMI.  Advanced Directives 05/01/2020 02/04/2020 02/22/2019 03/11/2017 04/17/2016 03/05/2016 01/17/2015  Does Patient Have a Medical Advance Directive? Yes No No No No No No  Type of Paramedic of [place];Living will - - - - - -  Would patient like information on creating a medical advance directive? - - No - Patient declined - No - Patient declined - -    Current Medications (verified) Outpatient Encounter Medications as of 05/01/2020  Medication Sig  . allopurinol (ZYLOPRIM) 300 MG tablet TAKE 1 TABLET BY MOUTH EVERY DAY  . aspirin 81 MG tablet Take 81 mg by mouth daily.   Marland Kitchen enoxaparin (LOVENOX) 40 MG/0.4ML injection Inject 0.4 mLs  (40 mg total) into the skin daily for 14 doses.  Marland Kitchen HYDROcodone-acetaminophen (NORCO/VICODIN) 5-325 MG tablet Take 1-2 tablets by mouth every 4 (four) hours as needed for up to 30 doses for moderate pain (pain score 4-6). (Patient not taking: Reported on 05/01/2020)  . Olmesartan-amLODIPine-HCTZ 40-10-25 MG TABS TAKE 1 TABLET BY MOUTH DAILY  . omeprazole (PRILOSEC) 20 MG capsule Take 20 mg by mouth daily.  . pravastatin (PRAVACHOL) 40 MG tablet TAKE 1 TABLET BY MOUTH EVERY DAY  . traMADol (ULTRAM) 50 MG tablet Take 1 tablet (50 mg total) by mouth every 6 (six) hours as needed for moderate pain. (Patient not taking: Reported on 05/01/2020)  . traZODone (DESYREL) 150 MG tablet TAKE 1 TABLET (150 MG TOTAL) BY MOUTH AT BEDTIME.   No facility-administered encounter medications on file as of 05/01/2020.    Allergies (verified) Patient has no known allergies.   History: Past Medical History:  Diagnosis Date  . GERD (gastroesophageal reflux disease)   . Gout   . Hyperlipidemia   . Hypertension   . Melanoma ([place])   . Melanoma ([place])   . Obstructive sleep apnea   . Tubular adenoma    Past Surgical History:  Procedure Laterality Date  . BASAL CELL CARCINOMA EXCISION    . COLONOSCOPY WITH PROPOFOL N/A 04/17/2016   Procedure: COLONOSCOPY WITH PROPOFOL;  Surgeon: Jonathon Bellows, MD;  Location: [facility] ENDOSCOPY;  Service: Endoscopy;  Laterality: N/A;  . MELANOMA EXCISION     x 2  . ORIF ANKLE FRACTURE Left 02/05/2020   Procedure: OPEN REDUCTION INTERNAL FIXATION (ORIF) ANKLE FRACTURE;  Surgeon: Corky Mull, MD;  Location: [facility] ORS;  Service: Orthopedics;  Laterality: Left;   Family History  Problem Relation Age of Onset  . Pancreatitis Mother   . Heart disease Father   . Hyperlipidemia Father   . Hypertension Father   . Heart attack Father    Social History   Socioeconomic History  . Marital status: Divorced    Spouse name: Not on file  . Number of children: 0  . Years of education: Not  on file  . Highest education level: High school graduate  Occupational History  . Occupation: retired  Tobacco Use  . Smoking status: Former Smoker    Years: 14.00    Quit date: 05/18/1977    Years since quitting: 42.9  . Smokeless tobacco: Never Used  Substance and Sexual Activity  . Alcohol use: Yes    Alcohol/week: 21.0 standard drinks    Types: 21 Cans of beer per week    Comment: 3 beers a day  . Drug use: No  . Sexual activity: Not Currently  Other Topics Concern  . Not on file  Social History Narrative  . Not on file   Social Determinants of Health   Financial Resource Strain: Low Risk   . Difficulty of Paying Living Expenses: Not hard at all  Food Insecurity: No Food Insecurity  . Worried About Charity fundraiser in the Last Year: Never true  . Ran Out of Food in the Last Year: Never true  Transportation Needs: No Transportation Needs  . Lack of Transportation (Medical): No  . Lack of Transportation (Non-Medical): No  Physical Activity: Insufficiently Active  . Days of Exercise per Week: 2 days  . Minutes of Exercise per Session: 50 min  Stress: No Stress Concern Present  . Feeling of Stress : Only a little  Social Connections: Socially Isolated  . Frequency of Communication with Friends and Family: More than three times a week  . Frequency of Social Gatherings with Friends and Family: More than three times a week  . Attends Religious Services: Never  . Active Member of Clubs or Organizations: No  . Attends Archivist Meetings: Never  . Marital Status: Divorced    Tobacco Counseling Counseling given: Not Answered   Clinical Intake:  Pre-visit preparation completed: Yes  Pain : No/denies pain     Nutritional Risks: None Diabetes: No  How often do you need to have someone help you when you read instructions, pamphlets, or other written materials from your doctor or pharmacy?: 1 - Never  Diabetic? No  Interpreter Needed?:  No  Information entered by :: [facility], LPN   Activities of Daily Living In your present state of health, do you have any difficulty performing the following activities: 05/01/2020 02/13/2020  Hearing? N N  Vision? N N  Difficulty concentrating or making decisions? N N  Walking or climbing stairs? N Y  Dressing or bathing? N N  Doing errands, shopping? N N  Preparing Food and eating ? N -  Using the Toilet? N -  In the past six months, have you accidently leaked urine? N -  Do you have problems with loss of bowel control? N -  Managing your Medications? N -  Managing your Finances? N -  Housekeeping or managing your Housekeeping? N -  Some recent data might be hidden    Patient Care Team: Jerrol Banana., MD as PCP - General (Family Medicine)  Indicate any recent Medical Services you may have received from other than [facility] providers  in the past year (date may be approximate).     Assessment:   This is a routine wellness examination for [employer].  Hearing/Vision screen No exam data present  Dietary issues and exercise activities discussed: Current Exercise Habits: Home exercise routine, Type of exercise: walking, Time (Minutes): 45 (to 1 hour), Frequency (Times/Week): 5, Weekly Exercise (Minutes/Week): 225, Intensity: Mild, Exercise limited by: None identified  Goals    . Prevent falls     Recommend to remove any items from the home that may cause slips or trips.      Depression Screen PHQ 2/9 Scores 05/01/2020 02/13/2020 01/17/2020 02/22/2019 01/06/2019 10/19/2017 09/09/2017  PHQ - 2 Score 0 0 0 0 0 3 0  PHQ- 9 Score - 1 2 - 4 9 -    Fall Risk Fall Risk  05/01/2020 02/13/2020 01/17/2020 02/22/2019 01/06/2019  Falls in the past year? 0 1 0 0 0  Number falls in past yr: 0 1 0 0 -  Injury with Fall? 0 1 0 0 -  Comment - fractured leg - - -  Follow up - Falls evaluation completed Falls evaluation completed - Falls evaluation completed    FALL RISK PREVENTION PERTAINING  TO THE HOME:  Any stairs in or around the home? Yes  If so, are there any without handrails? No  Home free of loose throw rugs in walkways, pet beds, electrical cords, etc? Yes  Adequate lighting in your home to reduce risk of falls? Yes   ASSISTIVE DEVICES UTILIZED TO PREVENT FALLS:  Life alert? No  Use of a cane, walker or w/c? No  Grab bars in the bathroom? No  Shower chair or bench in shower? No  Elevated toilet seat or a handicapped toilet? No    Cognitive Function: Normal cognitive status assessed by observation by this Nurse Health Advisor. No abnormalities found.       6CIT Screen 02/22/2019  What Year? 0 points  What month? 0 points  What time? 0 points  Count back from 20 0 points  Months in reverse 0 points  Repeat phrase 6 points  Total Score 6    Immunizations Immunization History  Administered Date(s) Administered  . Pneumococcal Conjugate-13 01/17/2015  . Pneumococcal Polysaccharide-23 03/05/2016  . Tdap 01/11/2013  . Zoster 01/11/2013    TDAP status: Up to date  Flu Vaccine status: Declined, Education has been provided regarding the importance of this vaccine but patient still declined. Advised may receive this vaccine at local pharmacy or Health Dept. Aware to provide a copy of the vaccination record if obtained from local pharmacy or Health Dept. Verbalized acceptance and understanding.  Pneumococcal vaccine status: Up to date  Covid-19 vaccine status: Declined, Education has been provided regarding the importance of this vaccine but patient still declined. Advised may receive this vaccine at local pharmacy or Health Dept.or vaccine clinic. Aware to provide a copy of the vaccination record if obtained from local pharmacy or Health Dept. Verbalized acceptance and understanding.  Qualifies for Shingles Vaccine? Yes   Zostavax completed Yes   Shingrix Completed?: No.    Education has been provided regarding the importance of this vaccine. Patient has  been advised to call insurance company to determine out of pocket expense if they have not yet received this vaccine. Advised may also receive vaccine at local pharmacy or Health Dept. Verbalized acceptance and understanding.  Screening Tests Health Maintenance  Topic Date Due  . COLONOSCOPY  04/18/2019  . COVID-19 Vaccine (1) 05/17/2020 (Originally  06/06/1962)  . INFLUENZA VACCINE  08/16/2020 (Originally 12/18/2019)  . TETANUS/TDAP  01/12/2023  . Hepatitis C Screening  Completed  . PNA vac Low Risk Adult  Completed    Health Maintenance  Health Maintenance Due  Topic Date Due  . COLONOSCOPY  04/18/2019    Colorectal cancer screening: Currently due, declined referral or cologuard order at this time.   Lung Cancer Screening: (Low Dose CT Chest recommended if Age 58-80 years, 30 pack-year currently smoking OR have quit w/in 15years.) does not qualify.   Additional Screening:  Hepatitis C Screening: Up to date  Vision Screening: Recommended annual ophthalmology exams for early detection of glaucoma and other disorders of the eye. Is the patient up to date with their annual eye exam? No, pt is moving in 2022 Who is the provider or what is the name of the office in which the patient attends annual eye exams? N/A If pt is not established with a provider, would they like to be referred to a provider to establish care? No .   Dental Screening: Recommended annual dental exams for proper oral hygiene  Community Resource Referral / Chronic Care Management: CRR required this visit?  No   CCM required this visit?  No      Plan:     I have personally reviewed and noted the following in the patient's chart:   . Medical and social history . Use of alcohol, tobacco or illicit drugs  . Current medications and supplements . Functional ability and status . Nutritional status . Physical activity . Advanced directives . List of other physicians . Hospitalizations, surgeries, and ER  visits in previous 12 months . Vitals . Screenings to include cognitive, depression, and falls . Referrals and appointments  In addition, I have reviewed and discussed with patient certain preventive protocols, quality metrics, and best practice recommendations. A written personalized care plan for preventive services as well as general preventive health recommendations were provided to patient.     Austin Pongratz [place], [place]   [id number]   Nurse Notes: Pt declined receiving a future flu or Covid vaccine. Pt also declined a colonoscopy referral or cologuard order at this time.

## 2020-05-01 ENCOUNTER — Other Ambulatory Visit: Payer: Self-pay

## 2020-05-01 ENCOUNTER — Ambulatory Visit (INDEPENDENT_AMBULATORY_CARE_PROVIDER_SITE_OTHER): Payer: Medicare Other

## 2020-05-01 DIAGNOSIS — Z0000 Encounter for general adult medical examination without abnormal findings: Secondary | ICD-10-CM

## 2020-05-01 NOTE — Patient Instructions (Signed)
Joe Blankenship , Thank you for taking time to come for your Medicare Wellness Visit. I appreciate your ongoing commitment to your health goals. Please review the following plan we discussed and let me know if I can assist you in the future.   Screening recommendations/referrals: Colonoscopy: Currently due, declined a colonoscopy referral or cologuard order at this time. Recommended yearly ophthalmology/optometry visit for glaucoma screening and checkup Recommended yearly dental visit for hygiene and checkup  Vaccinations: Influenza vaccine: Currently due, declined receiving.  Pneumococcal vaccine: Com/pleted series Tdap vaccine: Up to date, due 12/2022 Shingles vaccine: Shingrix discussed. Please contact your pharmacy for coverage information.     Advanced directives: Please bring a copy of your POA (Power of Attorney) and/or Living Will to your next appointment.   Conditions/risks identified: Fall risk preventatives discussed today.   Next appointment: None- pt is moving in 06/2020 and will be finding another PCP.  Preventive Care 71 Years and Older, Male Preventive care refers to lifestyle choices and visits with your health care provider that can promote health and wellness. What does preventive care include?  A yearly physical exam. This is also called an annual well check.  Dental exams once or twice a year.  Routine eye exams. Ask your health care provider how often you should have your eyes checked.  Personal lifestyle choices, including:  Daily care of your teeth and gums.  Regular physical activity.  Eating a healthy diet.  Avoiding tobacco and drug use.  Limiting alcohol use.  Practicing safe sex.  Taking low doses of aspirin every day.  Taking vitamin and mineral supplements as recommended by your health care provider. What happens during an annual well check? The services and screenings done by your health care provider during your annual well check will depend on  your age, overall health, lifestyle risk factors, and family history of disease. Counseling  Your health care provider may ask you questions about your:  Alcohol use.  Tobacco use.  Drug use.  Emotional well-being.  Home and relationship well-being.  Sexual activity.  Eating habits.  History of falls.  Memory and ability to understand (cognition).  Work and work Statistician. Screening  You may have the following tests or measurements:  Height, weight, and BMI.  Blood pressure.  Lipid and cholesterol levels. These may be checked every 5 years, or more frequently if you are over 60 years old.  Skin check.  Lung cancer screening. You may have this screening every year starting at age 71 if you have a 30-pack-year history of smoking and currently smoke or have quit within the past 15 years.  Fecal occult blood test (FOBT) of the stool. You may have this test every year starting at age 71.  Flexible sigmoidoscopy or colonoscopy. You may have a sigmoidoscopy every 5 years or a colonoscopy every 10 years starting at age 71.  Prostate cancer screening. Recommendations will vary depending on your family history and other risks.  Hepatitis C blood test.  Hepatitis B blood test.  Sexually transmitted disease (STD) testing.  Diabetes screening. This is done by checking your blood sugar (glucose) after you have not eaten for a while (fasting). You may have this done every 1-3 years.  Abdominal aortic aneurysm (AAA) screening. You may need this if you are a current or former smoker.  Osteoporosis. You may be screened starting at age 71 if you are at high risk. Talk with your health care provider about your test results, treatment options, and if  necessary, the need for more tests. Vaccines  Your health care provider may recommend certain vaccines, such as:  Influenza vaccine. This is recommended every year.  Tetanus, diphtheria, and acellular pertussis (Tdap, Td) vaccine.  You may need a Td booster every 10 years.  Zoster vaccine. You may need this after age 30.  Pneumococcal 13-valent conjugate (PCV13) vaccine. One dose is recommended after age 3.  Pneumococcal polysaccharide (PPSV23) vaccine. One dose is recommended after age 89. Talk to your health care provider about which screenings and vaccines you need and how often you need them. This information is not intended to replace advice given to you by your health care provider. Make sure you discuss any questions you have with your health care provider. Document Released: 06/01/2015 Document Revised: 01/23/2016 Document Reviewed: 03/06/2015 Elsevier Interactive Patient Education  2017 [place] Prevention in the Home Falls can cause injuries. They can happen to people of all ages. There are many things you can do to make your home safe and to help prevent falls. What can I do on the outside of my home?  Regularly fix the edges of walkways and driveways and fix any cracks.  Remove anything that might make you trip as you walk through a door, such as a raised step or threshold.  Trim any bushes or trees on the path to your home.  Use bright outdoor lighting.  Clear any walking paths of anything that might make someone trip, such as rocks or tools.  Regularly check to see if handrails are loose or broken. Make sure that both sides of any steps have handrails.  Any raised decks and porches should have guardrails on the edges.  Have any leaves, snow, or ice cleared regularly.  Use sand or salt on walking paths during winter.  Clean up any spills in your garage right away. This includes oil or grease spills. What can I do in the bathroom?  Use night lights.  Install grab bars by the toilet and in the tub and shower. Do not use towel bars as grab bars.  Use non-skid mats or decals in the tub or shower.  If you need to sit down in the shower, use a plastic, non-slip stool.  Keep the  floor dry. Clean up any water that spills on the floor as soon as it happens.  Remove soap buildup in the tub or shower regularly.  Attach bath mats securely with double-sided non-slip rug tape.  Do not have throw rugs and other things on the floor that can make you trip. What can I do in the bedroom?  Use night lights.  Make sure that you have a light by your bed that is easy to reach.  Do not use any sheets or blankets that are too big for your bed. They should not hang down onto the floor.  Have a firm chair that has side arms. You can use this for support while you get dressed.  Do not have throw rugs and other things on the floor that can make you trip. What can I do in the kitchen?  Clean up any spills right away.  Avoid walking on wet floors.  Keep items that you use a lot in easy-to-reach places.  If you need to reach something above you, use a strong step stool that has a grab bar.  Keep electrical cords out of the way.  Do not use floor polish or wax that makes floors slippery. If you must  use wax, use non-skid floor wax.  Do not have throw rugs and other things on the floor that can make you trip. What can I do with my stairs?  Do not leave any items on the stairs.  Make sure that there are handrails on both sides of the stairs and use them. Fix handrails that are broken or loose. Make sure that handrails are as long as the stairways.  Check any carpeting to make sure that it is firmly attached to the stairs. Fix any carpet that is loose or worn.  Avoid having throw rugs at the top or bottom of the stairs. If you do have throw rugs, attach them to the floor with carpet tape.  Make sure that you have a light switch at the top of the stairs and the bottom of the stairs. If you do not have them, ask someone to add them for you. What else can I do to help prevent falls?  Wear shoes that:  Do not have high heels.  Have rubber bottoms.  Are comfortable and fit  you well.  Are closed at the toe. Do not wear sandals.  If you use a stepladder:  Make sure that it is fully opened. Do not climb a closed stepladder.  Make sure that both sides of the stepladder are locked into place.  Ask someone to hold it for you, if possible.  Clearly mark and make sure that you can see:  Any grab bars or handrails.  First and last steps.  Where the edge of each step is.  Use tools that help you move around (mobility aids) if they are needed. These include:  Canes.  Walkers.  Scooters.  Crutches.  Turn on the lights when you go into a dark area. Replace any light bulbs as soon as they burn out.  Set up your furniture so you have a clear path. Avoid moving your furniture around.  If any of your floors are uneven, fix them.  If there are any pets around you, be aware of where they are.  Review your medicines with your doctor. Some medicines can make you feel dizzy. This can increase your chance of falling. Ask your doctor what other things that you can do to help prevent falls. This information is not intended to replace advice given to you by your health care provider. Make sure you discuss any questions you have with your health care provider. Document Released: 03/01/2009 Document Revised: 10/11/2015 Document Reviewed: 06/09/2014 Elsevier Interactive Patient Education  2017 [employer].

## 2020-07-11 ENCOUNTER — Other Ambulatory Visit: Payer: Self-pay | Admitting: Family Medicine

## 2020-07-11 DIAGNOSIS — G4700 Insomnia, unspecified: Secondary | ICD-10-CM

## 2020-08-08 ENCOUNTER — Other Ambulatory Visit: Payer: Self-pay | Admitting: Family Medicine

## 2020-08-08 DIAGNOSIS — M109 Gout, unspecified: Secondary | ICD-10-CM

## 2020-08-08 NOTE — Telephone Encounter (Signed)
Requested Prescriptions  Pending Prescriptions Disp Refills  . allopurinol (ZYLOPRIM) 300 MG tablet [Pharmacy Med Name: ALLOPURINOL 300 MG TABLET] 90 tablet 1    Sig: TAKE 1 TABLET BY MOUTH EVERY DAY     Endocrinology:  Gout Agents Failed - 08/08/2020  1:18 AM      Failed - Uric Acid in normal range and within 360 days    Uric Acid  Date Value Ref Range Status  09/09/2017 6.3 3.7 - 8.6 mg/dL Final    Comment:               Therapeutic target for gout patients: <6.0         Passed - Cr in normal range and within 360 days    Creat  Date Value Ref Range Status  03/11/2017 0.80 0.70 - 1.25 mg/dL Final    Comment:    For patients >47 years of age, the reference limit for Creatinine is approximately 13% higher for people identified as African-American. .    Creatinine, Ser  Date Value Ref Range Status  02/04/2020 0.93 0.61 - 1.24 mg/dL Final         Passed - Valid encounter within last 12 months    Recent Outpatient Visits          5 months ago Closed trimalleolar fracture of left ankle, sequela   [facility] Jerrol Banana., MD   6 months ago Essential (primary) hypertension   [facility] Jerrol Banana., MD   9 months ago Essential (primary) hypertension   [facility] Jerrol Banana., MD   10 months ago Insomnia, unspecified type   [facility] Jerrol Banana., MD   1 year ago Essential (primary) hypertension   [facility] Jerrol Banana., MD

## 2020-09-10 ENCOUNTER — Other Ambulatory Visit: Payer: Self-pay | Admitting: Family Medicine

## 2020-09-10 NOTE — Telephone Encounter (Signed)
Requested medication (s) are due for refill today: yes  Requested medication (s) are on the active medication list: yes   Last refill: 08/09/2020  Future visit scheduled: no  Notes to clinic:  overdue for follow up appointment    Requested Prescriptions  Pending Prescriptions Disp Refills   Olmesartan-amLODIPine-HCTZ 40-10-25 MG TABS [Pharmacy Med Name: OLMSRTN-AMLDPN-HCTZ 40-10-25MG ] 30 tablet 5    Sig: TAKE 1 TABLET BY MOUTH DAILY      Cardiovascular: CCB + ARB + Diuretic Combos Failed - 09/10/2020  1:20 AM      Failed - K in normal range and within 180 days    Potassium  Date Value Ref Range Status  02/04/2020 3.8 3.5 - 5.1 mmol/L Final          Failed - Na in normal range and within 180 days    Sodium  Date Value Ref Range Status  02/04/2020 137 135 - 145 mmol/L Final  01/06/2019 137 134 - 144 mmol/L Final          Failed - Cr in normal range and within 180 days    Creat  Date Value Ref Range Status  03/11/2017 0.80 0.70 - 1.25 mg/dL Final    Comment:    For patients >65 years of age, the reference limit for Creatinine is approximately 13% higher for people identified as African-American. .    Creatinine, Ser  Date Value Ref Range Status  02/04/2020 0.93 0.61 - 1.24 mg/dL Final          Failed - Ca in normal range and within 180 days    Calcium  Date Value Ref Range Status  02/04/2020 9.1 8.9 - 10.3 mg/dL Final          Failed - Valid encounter within last 6 months    Recent Outpatient Visits           7 months ago Closed trimalleolar fracture of left ankle, sequela   [facility] Jerrol Banana., MD   7 months ago Essential (primary) hypertension   [facility] Jerrol Banana., MD   10 months ago Essential (primary) hypertension   [facility] Jerrol Banana., MD   11 months ago Insomnia, unspecified type   [facility] Jerrol Banana., MD   1 year ago Essential  (primary) hypertension   [facility] Jerrol Banana., MD                Passed - Patient is not pregnant      Passed - Last BP in normal range    BP Readings from Last 1 Encounters:  02/13/20 101/68          Passed - Last Heart Rate in normal range    Pulse Readings from Last 1 Encounters:  02/13/20 80

## 2020-10-06 ENCOUNTER — Other Ambulatory Visit: Payer: Self-pay | Admitting: Family Medicine

## 2020-10-06 DIAGNOSIS — G4700 Insomnia, unspecified: Secondary | ICD-10-CM

## 2020-10-06 NOTE — Telephone Encounter (Signed)
Requested medication (s) are due for refill today: yes  Requested medication (s) are on the active medication list: yes  Last refill:  07/11/20  Future visit scheduled: no  Notes to clinic:  Called pt and LM on VM to call office to make appt.    Requested Prescriptions  Pending Prescriptions Disp Refills   traZODone (DESYREL) 150 MG tablet [Pharmacy Med Name: TRAZODONE 150 MG TABLET] 30 tablet 2    Sig: TAKE 1 TABLET BY MOUTH AT BEDTIME.      Psychiatry: Antidepressants - Serotonin Modulator Failed - 10/06/2020  2:59 PM      Failed - Valid encounter within last 6 months    Recent Outpatient Visits           7 months ago Closed trimalleolar fracture of left ankle, sequela   [facility] Jerrol Banana., MD   8 months ago Essential (primary) hypertension   [facility] Jerrol Banana., MD   11 months ago Essential (primary) hypertension   [facility] Jerrol Banana., MD   1 year ago Insomnia, unspecified type   [facility] Jerrol Banana., MD   1 year ago Essential (primary) hypertension   [facility] Jerrol Banana., MD

## 2021-01-07 ENCOUNTER — Other Ambulatory Visit: Payer: Self-pay | Admitting: Family Medicine

## 2021-01-07 NOTE — Telephone Encounter (Signed)
Requested medication (s) are due for refill today: yes   Requested medication (s) are on the active medication list: yes   Last refill:  12/10/2020  Future visit scheduled: no  Notes to clinic:  Failed protocol:  Total Cholesterol in normal range and within 360 days   LDL in normal range and within 360 days   HDL in normal range and within 360 days   Triglycerides in normal range and within 360 days     Requested Prescriptions  Pending Prescriptions Disp Refills   pravastatin (PRAVACHOL) 40 MG tablet [Pharmacy Med Name: PRAVASTATIN SODIUM 40 MG TAB] 30 tablet 3    Sig: TAKE 1 TABLET BY MOUTH EVERY DAY     Cardiovascular:  Antilipid - Statins Failed - 01/07/2021  1:27 AM      Failed - Total Cholesterol in normal range and within 360 days    Cholesterol, Total  Date Value Ref Range Status  01/06/2019 222 (H) 100 - 199 mg/dL Final          Failed - LDL in normal range and within 360 days    LDL Cholesterol (Calc)  Date Value Ref Range Status  03/11/2017  mg/dL (calc) Final    Comment:    . LDL cholesterol not calculated. Triglyceride levels greater than 400 mg/dL invalidate calculated LDL results. . Reference range: <100 . Desirable range <100 mg/dL for primary prevention;   <70 mg/dL for patients with CHD or diabetic patients  with > or = 2 CHD risk factors. Marland Kitchen LDL-C is now calculated using the Martin-Hopkins  calculation, which is a validated novel method providing  better accuracy than the Friedewald equation in the  estimation of LDL-C.  Cresenciano Genre et al. Annamaria Helling. [id number]): 2061-2068  (http://education.[facility].com/faq/FAQ164)    LDL Calculated  Date Value Ref Range Status  01/06/2019 87 0 - 99 mg/dL Final          Failed - HDL in normal range and within 360 days    HDL  Date Value Ref Range Status  01/06/2019 56 >39 mg/dL Final          Failed - Triglycerides in normal range and within 360 days    Triglycerides  Date Value Ref Range Status   01/06/2019 397 (H) 0 - 149 mg/dL Final          Passed - Patient is not pregnant      Passed - Valid encounter within last 12 months    Recent Outpatient Visits           10 months ago Closed trimalleolar fracture of left ankle, sequela   [facility] Jerrol Banana., MD   11 months ago Essential (primary) hypertension   [facility] Jerrol Banana., MD   1 year ago Essential (primary) hypertension   [facility] Jerrol Banana., MD   1 year ago Insomnia, unspecified type   [facility] Jerrol Banana., MD   1 year ago Essential (primary) hypertension   [facility] Jerrol Banana., MD

## 2021-02-06 ENCOUNTER — Telehealth: Payer: Self-pay

## 2021-02-06 ENCOUNTER — Other Ambulatory Visit: Payer: Self-pay | Admitting: Family Medicine

## 2021-02-06 NOTE — Telephone Encounter (Signed)
[facility] [place], [place]   Thanks,   -Mickel Baas

## 2021-02-06 NOTE — Telephone Encounter (Signed)
Copied from [id number]. Topic: Quick Communication - Rx Refill/Question >> Feb 06, 2021  1:31 PM Pawlus, Joe Blankenship wrote: Pt stated he recently moved to [place] and wanted to know if Dr Rosanna Randy would be willing to refill all his prescriptions until he can find Blankenship new doctor, please advise.

## 2021-02-06 NOTE — Telephone Encounter (Signed)
Requested medications are due for refill today.  yes  Requested medications are on the active medications list.  yes  Last refill. 01/07/2021  Future visit scheduled.   no  Notes to clinic.  Labs are expired. Patient already given a courtesy refill.

## 2021-02-07 ENCOUNTER — Other Ambulatory Visit: Payer: Self-pay | Admitting: Family Medicine

## 2021-02-07 DIAGNOSIS — M109 Gout, unspecified: Secondary | ICD-10-CM

## 2021-02-07 NOTE — Telephone Encounter (Signed)
Requested medications are due for refill today.  yes  Requested medications are on the active medications list.  yes  Last refill. 08/08/2020  Future visit scheduled.   no  Notes to clinic.  Labs are expired.

## 2021-02-12 ENCOUNTER — Telehealth: Payer: Self-pay

## 2021-02-12 DIAGNOSIS — E7800 Pure hypercholesterolemia, unspecified: Secondary | ICD-10-CM

## 2021-02-12 MED ORDER — PRAVASTATIN SODIUM 40 MG PO TABS
40.0000 mg | ORAL_TABLET | Freq: Every day | ORAL | 0 refills | Status: AC
Start: 2021-02-12 — End: ?

## 2021-02-12 NOTE — Telephone Encounter (Signed)
[facility] request for the following medications:  pravastatin (PRAVACHOL) 40 MG tablet   Pt has moved to [place] and it looks like Dr. Rosanna Randy had agreed to fill medications in previous message.  Please advise.

## 2021-02-12 NOTE — Telephone Encounter (Signed)
Rx was sent to pharmacy. 

## 2021-03-07 ENCOUNTER — Telehealth: Payer: Self-pay | Admitting: Family Medicine

## 2021-03-07 ENCOUNTER — Other Ambulatory Visit: Payer: Self-pay

## 2021-03-07 ENCOUNTER — Other Ambulatory Visit: Payer: Self-pay | Admitting: Family Medicine

## 2021-03-07 MED ORDER — OLMESARTAN-AMLODIPINE-HCTZ 40-10-25 MG PO TABS: 1.0000 | ORAL_TABLET | Freq: Every day | ORAL | 1 refills | Status: AC

## 2021-03-07 NOTE — Telephone Encounter (Signed)
Requested medications are due for refill today.  yes  Requested medications are on the active medications list.  yes  Last refill. 09/10/2020  Future visit scheduled.   no  Notes to clinic.  Patient is more than 3 months overdue for office visit.

## 2021-03-07 NOTE — Telephone Encounter (Signed)
Pt called and stated that the pharmacy stated they havent received approval to release Rx for  Olmesartan-amLODIPine-HCTZ 40-10-25 MG TABS  /Pt has moved and is still trying to find a provider / Pt would like a refill/ stated he spoke to Dr. Rosanna Randy about this Joe Blankenship advise   [id number] [place], [place] - [address] AT [place] Phone:  [phone]  Fax:  [phone]

## 2021-03-07 NOTE — Telephone Encounter (Signed)
Rx sent to pharmacy   

## 2021-04-12 ENCOUNTER — Other Ambulatory Visit: Payer: Self-pay | Admitting: Family Medicine

## 2021-04-12 DIAGNOSIS — G4700 Insomnia, unspecified: Secondary | ICD-10-CM

## 2021-05-10 ENCOUNTER — Other Ambulatory Visit: Payer: Self-pay | Admitting: Family Medicine

## 2021-05-10 DIAGNOSIS — E7800 Pure hypercholesterolemia, unspecified: Secondary | ICD-10-CM

## 2021-05-10 NOTE — Telephone Encounter (Signed)
Requested medication (s) are due for refill today: yes  Requested medication (s) are on the active medication list: yes  Last refill:  02/12/21  Future visit scheduled: no  Notes to clinic:  Has already had a curtesy refill and there is no upcoming appointment scheduled, labs are greater than 360 days ago, 01/06/19.  Requested Prescriptions  Pending Prescriptions Disp Refills   pravastatin (PRAVACHOL) 40 MG tablet [Pharmacy Med Name: PRAVASTATIN SODIUM 40 MG TAB] 90 tablet 0    Sig: TAKE 1 TABLET BY MOUTH EVERY DAY     Cardiovascular:  Antilipid - Statins Failed - 05/10/2021  1:16 AM      Failed - Total Cholesterol in normal range and within 360 days    Cholesterol, Total  Date Value Ref Range Status  01/06/2019 222 (H) 100 - 199 mg/dL Final          Failed - LDL in normal range and within 360 days    LDL Cholesterol (Calc)  Date Value Ref Range Status  03/11/2017  mg/dL (calc) Final    Comment:    . LDL cholesterol not calculated. Triglyceride levels greater than 400 mg/dL invalidate calculated LDL results. . Reference range: <100 . Desirable range <100 mg/dL for primary prevention;   <70 mg/dL for patients with CHD or diabetic patients  with > or = 2 CHD risk factors. Marland Kitchen LDL-C is now calculated using the Martin-Hopkins  calculation, which is a validated novel method providing  better accuracy than the Friedewald equation in the  estimation of LDL-C.  Cresenciano Genre et al. Annamaria Helling. 0488;891(69): 2061-2068  (http://education.[facility].com/faq/FAQ164)    LDL Calculated  Date Value Ref Range Status  01/06/2019 87 0 - 99 mg/dL Final          Failed - HDL in normal range and within 360 days    HDL  Date Value Ref Range Status  01/06/2019 56 >39 mg/dL Final          Failed - Triglycerides in normal range and within 360 days    Triglycerides  Date Value Ref Range Status  01/06/2019 397 (H) 0 - 149 mg/dL Final          Failed - Valid encounter within last 12  months    Recent Outpatient Visits           1 year ago Closed trimalleolar fracture of left ankle, sequela   [facility] Jerrol Banana., MD   1 year ago Essential (primary) hypertension   [facility] Jerrol Banana., MD   1 year ago Essential (primary) hypertension   [facility] Jerrol Banana., MD   1 year ago Insomnia, unspecified type   [facility] Jerrol Banana., MD   2 years ago Essential (primary) hypertension   [facility] Jerrol Banana., MD              Passed - Patient is not pregnant

## 2021-07-02 ENCOUNTER — Other Ambulatory Visit: Payer: Self-pay | Admitting: Family Medicine

## 2021-07-02 DIAGNOSIS — G4700 Insomnia, unspecified: Secondary | ICD-10-CM

## 2022-07-15 IMAGING — DX DG CHEST 1V PORT
1 series · 1 of 1 positions shown · non-contrast
Comparison: None.

CLINICAL DATA: Preoperative examination.

EXAM:
PORTABLE CHEST 1 VIEW

[chest ap]
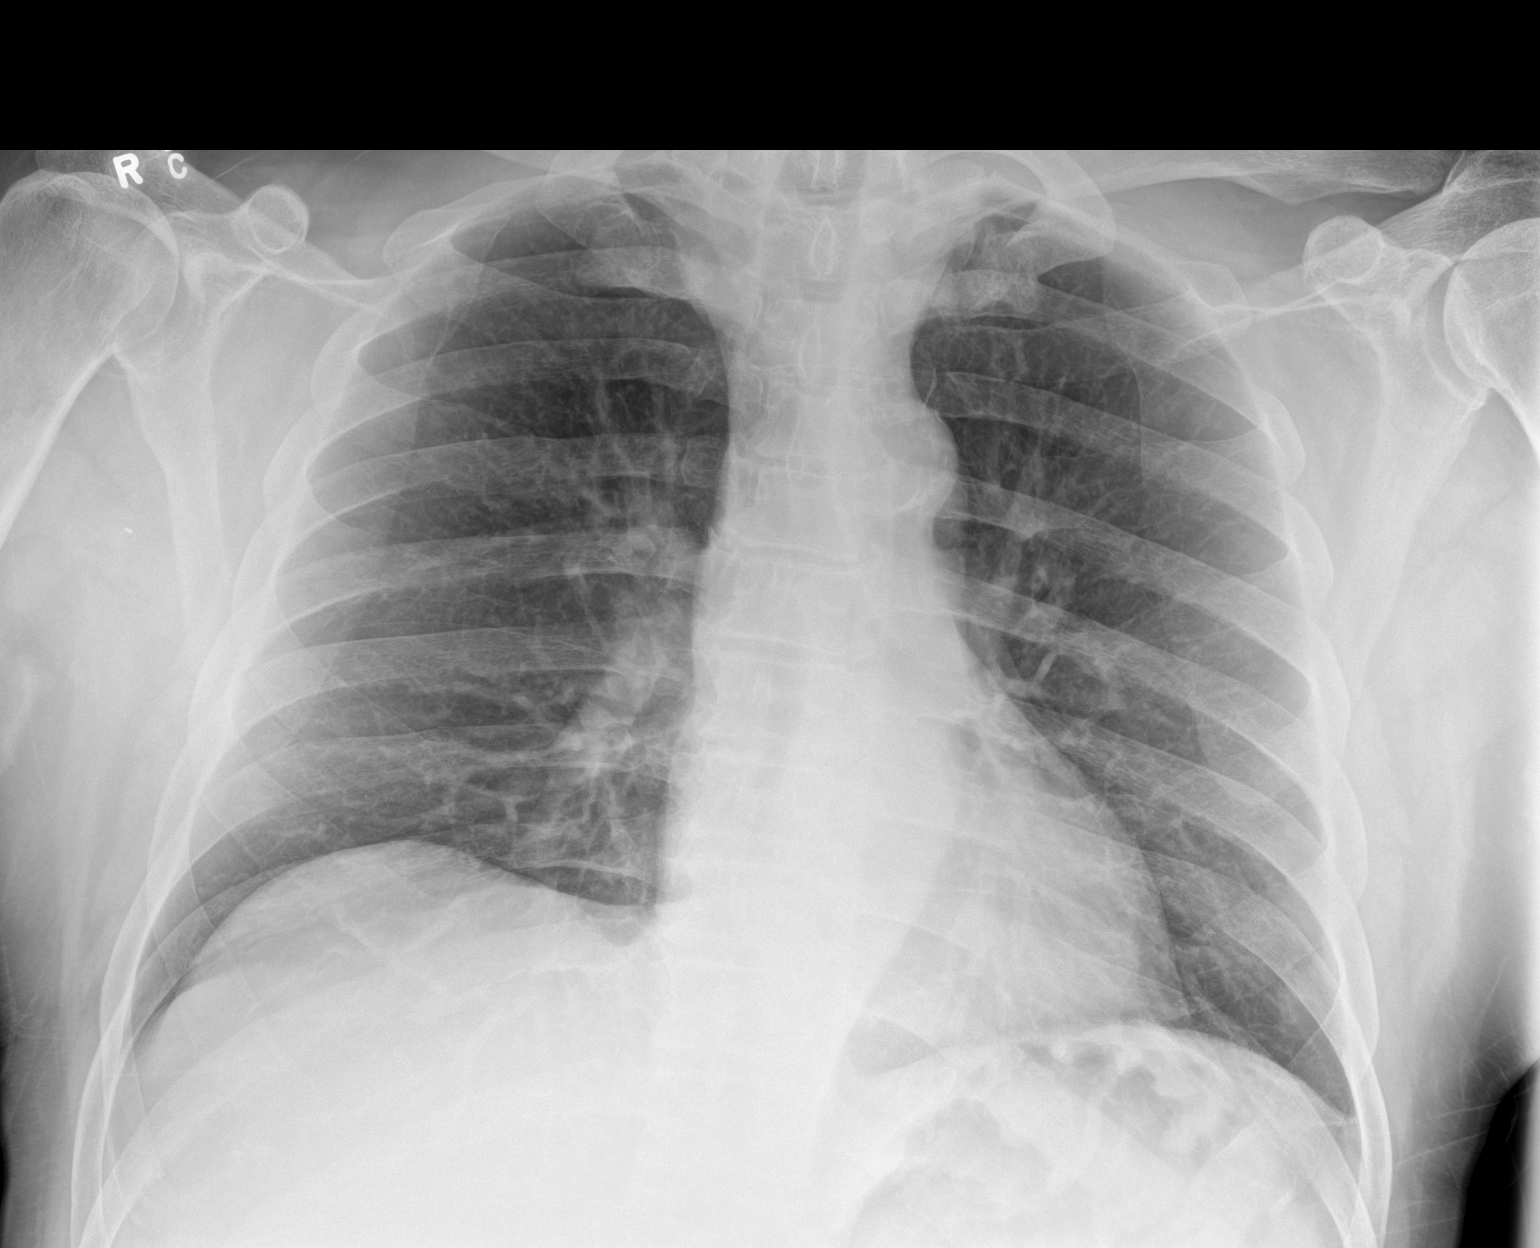

[1 of 1 positions shown; findings below may reference images not displayed]

FINDINGS: The heart size and mediastinal contours are within normal limits.
Both lungs are clear. The visualized skeletal structures are
unremarkable.
IMPRESSION: No active disease.

## 2022-07-16 IMAGING — RF DG C-ARM 1-60 MIN
1 series · 3 of 3 positions shown · non-contrast
Comparison: 02/04/2020

CLINICAL DATA: Fixation left ankle fracture.

EXAM:
LEFT ANKLE - 2 VIEW; DG C-ARM 1-60 MIN

[Series 1: run · 3 of 3 slices shown]
[im 1/3]
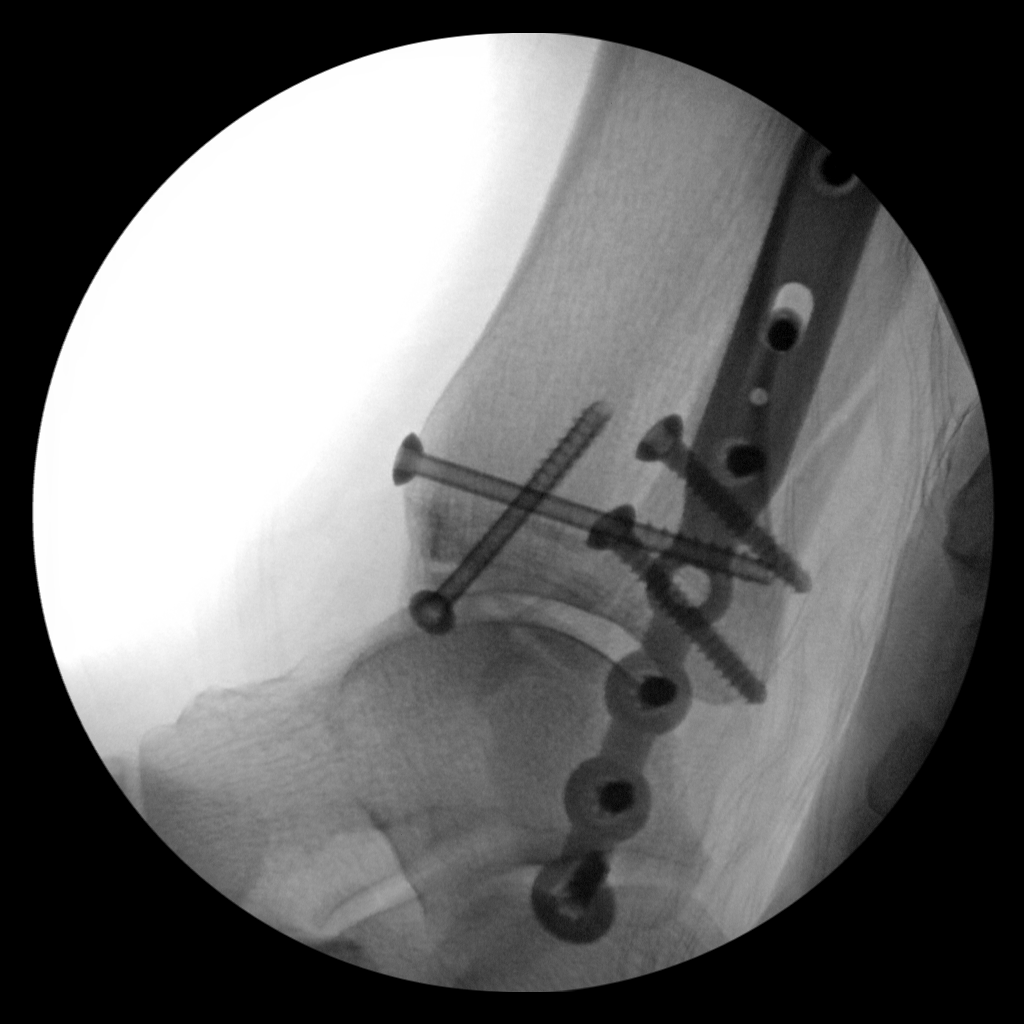
[im 2/3]
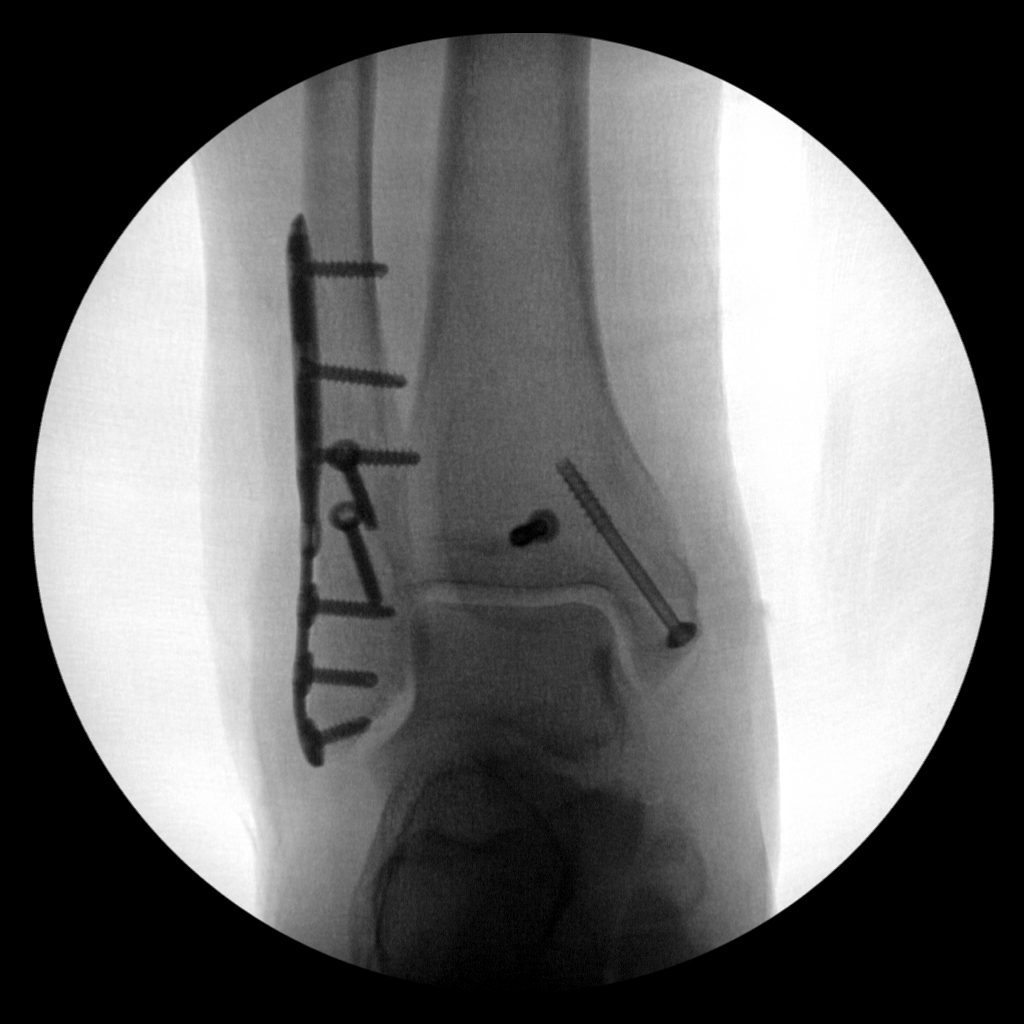
[im 3/3]
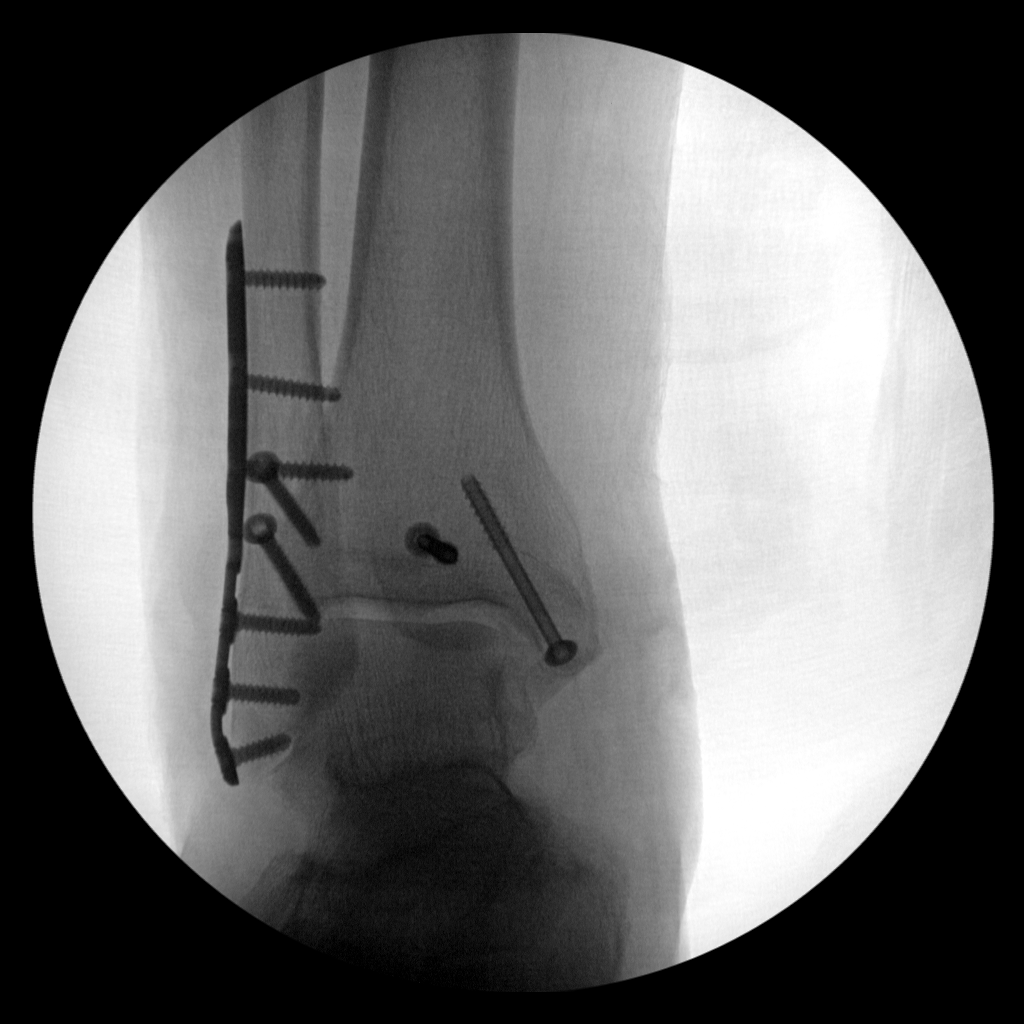

[3 of 3 positions shown; findings below may reference images not displayed]

FINDINGS: Fixation of patient's distal fibular fracture with lateral fixation
plate and screws with hardware intact and anatomic alignment over
the fracture site. Ankle mortise is normal. Two orthopedic screws
over the distal tibia fixating patient's posterior and medial
malleolar fractures with hardware intact and anatomic alignment over
the fracture sites.
IMPRESSION: Hardware intact and anatomic alignment over the distal tibia and
fibular fractures.

## 2022-07-16 IMAGING — RF DG ANKLE 2V *L*
1 series · 3 of 3 positions shown · non-contrast
Comparison: 02/04/2020

CLINICAL DATA: Fixation left ankle fracture.

EXAM:
LEFT ANKLE - 2 VIEW; DG C-ARM 1-60 MIN

[Series 1: run · 3 of 3 slices shown]
[im 1/3]
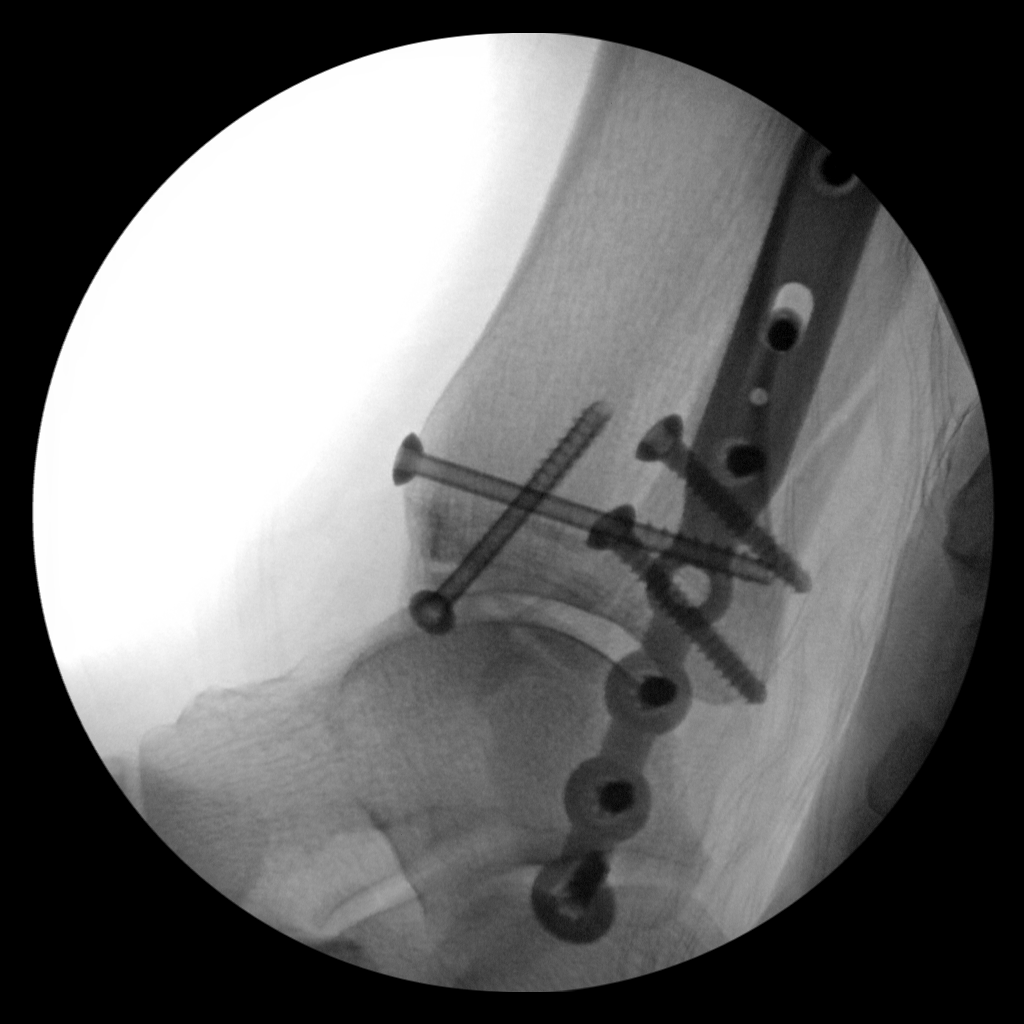
[im 2/3]
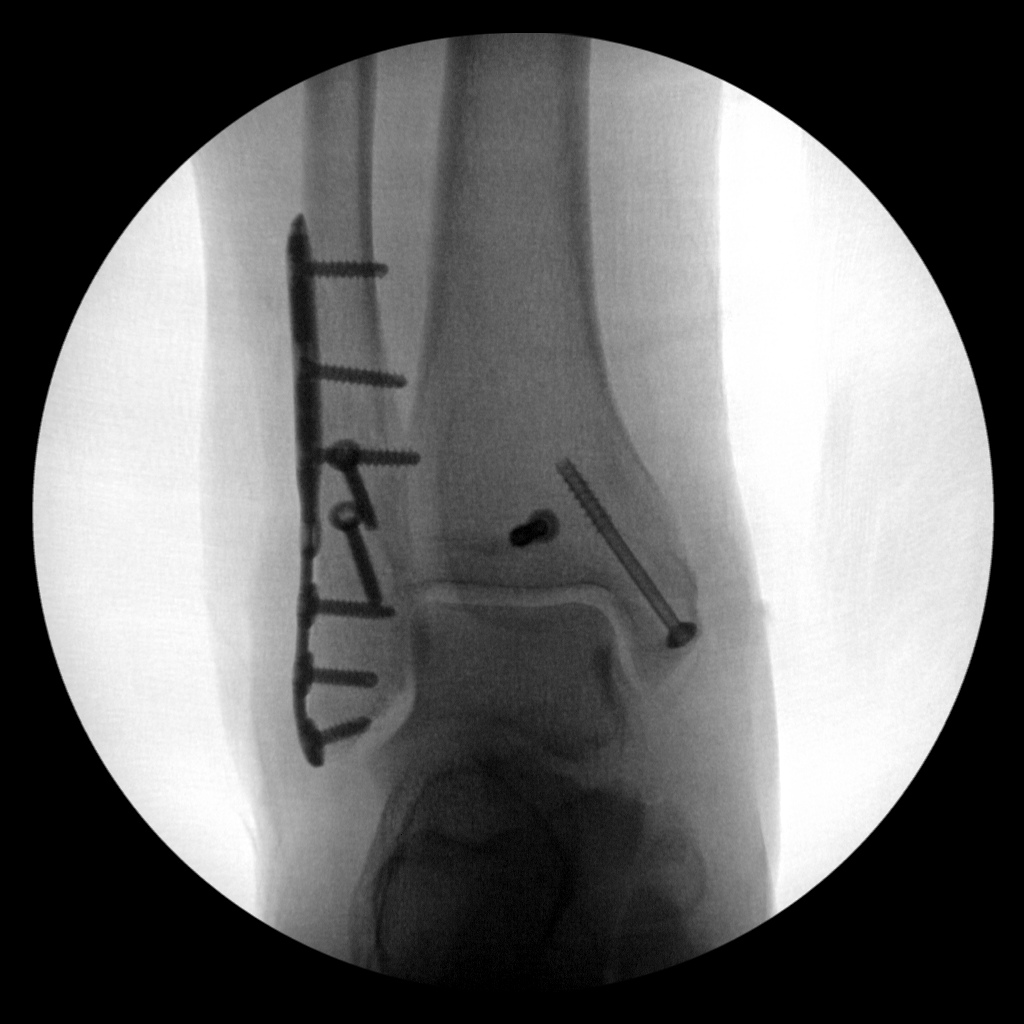
[im 3/3]
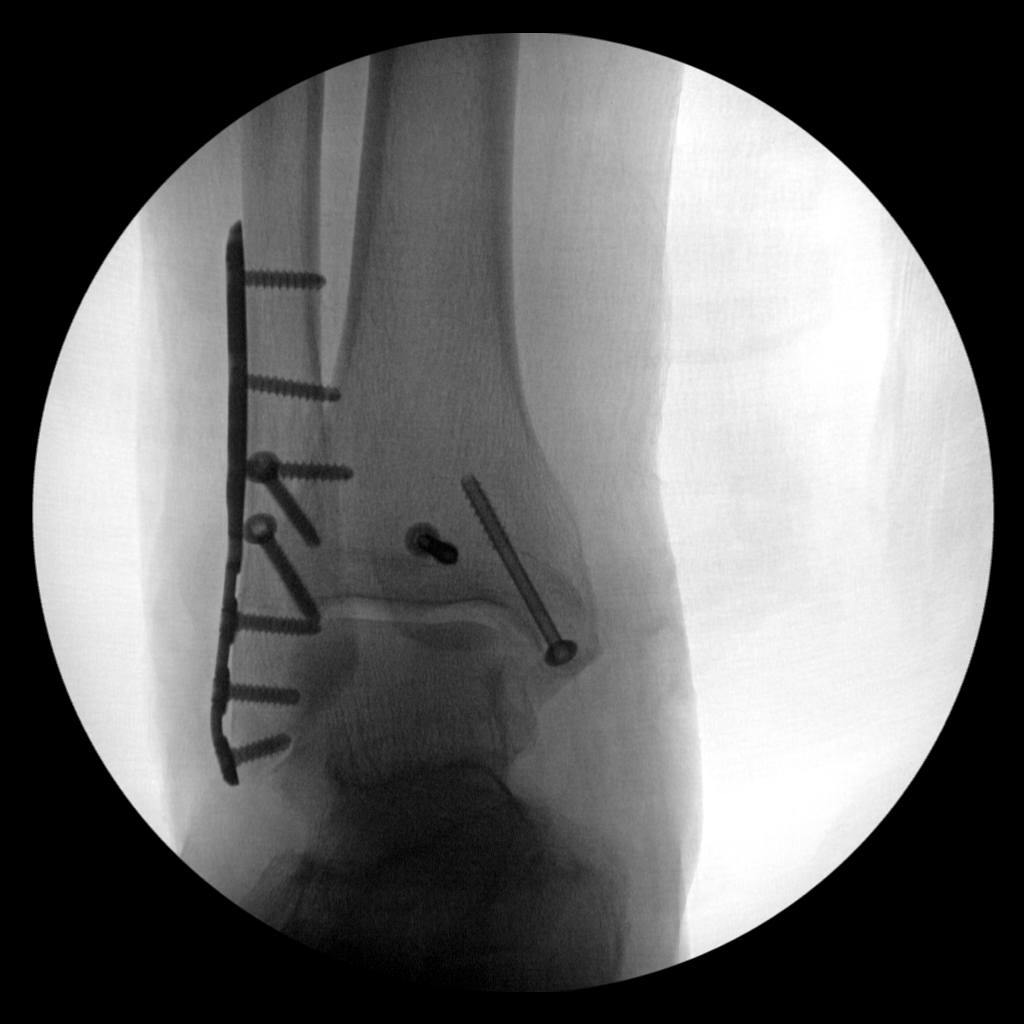

[3 of 3 positions shown; findings below may reference images not displayed]

FINDINGS: Fixation of patient's distal fibular fracture with lateral fixation
plate and screws with hardware intact and anatomic alignment over
the fracture site. Ankle mortise is normal. Two orthopedic screws
over the distal tibia fixating patient's posterior and medial
malleolar fractures with hardware intact and anatomic alignment over
the fracture sites.
IMPRESSION: Hardware intact and anatomic alignment over the distal tibia and
fibular fractures.

## 2024-02-17 DEATH — deceased
